# Patient Record
Sex: Female | Born: 1965 | Race: Black or African American | Hispanic: No | Smoking: Never smoker
Health system: Southern US, Community
[De-identification: ages and names within clinical notes are randomized; demographics above are authoritative.]

## PROBLEM LIST (undated history)

## (undated) DIAGNOSIS — I10 Essential (primary) hypertension: Secondary | ICD-10-CM

## (undated) DIAGNOSIS — J45909 Unspecified asthma, uncomplicated: Secondary | ICD-10-CM

## (undated) DIAGNOSIS — IMO0002 Reserved for concepts with insufficient information to code with codable children: Secondary | ICD-10-CM

## (undated) DIAGNOSIS — M329 Systemic lupus erythematosus, unspecified: Secondary | ICD-10-CM

## (undated) HISTORY — PX: TUBAL LIGATION: SHX77

## (undated) HISTORY — PX: ABDOMINAL HYSTERECTOMY: SHX81

## (undated) HISTORY — PX: TONSILLECTOMY: SUR1361

---

## 2012-11-22 ENCOUNTER — Ambulatory Visit: Payer: Self-pay | Admitting: Family Medicine

## 2012-11-22 ENCOUNTER — Emergency Department: Payer: Self-pay | Admitting: Emergency Medicine

## 2012-11-22 LAB — URINALYSIS, COMPLETE
Bacteria: NEGATIVE
Bacteria: NONE SEEN
Bilirubin,UR: NEGATIVE
Blood: NEGATIVE
Blood: NEGATIVE
Glucose,UR: NEGATIVE mg/dL (ref 0–75)
Glucose,UR: NEGATIVE mg/dL (ref 0–75)
Ketone: NEGATIVE
Leukocyte Esterase: NEGATIVE
Leukocyte Esterase: NEGATIVE
Nitrite: NEGATIVE
Nitrite: NEGATIVE
Ph: 5 (ref 4.5–8.0)
RBC,UR: 1 /HPF (ref 0–5)
Specific Gravity: 1.021 (ref 1.003–1.030)
Squamous Epithelial: 1
WBC UR: 1 /HPF (ref 0–5)

## 2012-11-22 LAB — COMPREHENSIVE METABOLIC PANEL
Albumin: 3.8 g/dL (ref 3.4–5.0)
Anion Gap: 3 — ABNORMAL LOW (ref 7–16)
BUN: 14 mg/dL (ref 7–18)
Bilirubin,Total: 0.3 mg/dL (ref 0.2–1.0)
Calcium, Total: 9.1 mg/dL (ref 8.5–10.1)
Chloride: 106 mmol/L (ref 98–107)
EGFR (African American): >60
Glucose: 99 mg/dL (ref 65–99)
Osmolality: 276 (ref 275–301)
Potassium: 4.2 mmol/L (ref 3.5–5.1)
SGOT(AST): 17 U/L (ref 15–37)
Sodium: 138 mmol/L (ref 136–145)
Total Protein: 8.3 g/dL — ABNORMAL HIGH (ref 6.4–8.2)

## 2012-11-22 LAB — CBC
HCT: 36 % (ref 35.0–47.0)
HGB: 11.5 g/dL — ABNORMAL LOW (ref 12.0–16.0)
MCH: 23.6 pg — ABNORMAL LOW (ref 26.0–34.0)
MCHC: 32 g/dL (ref 32.0–36.0)
RDW: 16.7 % — ABNORMAL HIGH (ref 11.5–14.5)

## 2012-11-22 LAB — LIPASE, BLOOD: Lipase: 169 U/L (ref 73–393)

## 2012-11-23 LAB — GC/CHLAMYDIA PROBE AMP

## 2012-11-24 LAB — URINE CULTURE

## 2013-04-26 ENCOUNTER — Ambulatory Visit: Payer: Self-pay | Admitting: Physician Assistant

## 2013-05-12 ENCOUNTER — Ambulatory Visit: Payer: Self-pay | Admitting: Physician Assistant

## 2013-05-16 ENCOUNTER — Emergency Department: Payer: Self-pay | Admitting: Emergency Medicine

## 2013-05-16 LAB — URINALYSIS, COMPLETE
Bacteria: NONE SEEN
Bilirubin,UR: NEGATIVE
Blood: NEGATIVE
GLUCOSE, UR: NEGATIVE mg/dL (ref 0–75)
Ketone: NEGATIVE
LEUKOCYTE ESTERASE: NEGATIVE
NITRITE: NEGATIVE
Ph: 5 (ref 4.5–8.0)
Protein: NEGATIVE
RBC,UR: 2 /HPF (ref 0–5)
Specific Gravity: 1.019 (ref 1.003–1.030)
Squamous Epithelial: 1

## 2013-05-16 LAB — GC/CHLAMYDIA PROBE AMP

## 2013-05-16 LAB — WET PREP, GENITAL

## 2014-03-18 ENCOUNTER — Emergency Department: Payer: Self-pay | Admitting: Emergency Medicine

## 2014-03-18 LAB — BASIC METABOLIC PANEL
Anion Gap: 6 — ABNORMAL LOW (ref 7–16)
BUN: 16 mg/dL (ref 7–18)
CALCIUM: 8.3 mg/dL — AB (ref 8.5–10.1)
CREATININE: 1.12 mg/dL (ref 0.60–1.30)
Chloride: 105 mmol/L (ref 98–107)
Co2: 31 mmol/L (ref 21–32)
EGFR (African American): >60
EGFR (Non-African Amer.): 55 — ABNORMAL LOW
GLUCOSE: 120 mg/dL — AB (ref 65–99)
OSMOLALITY: 286 (ref 275–301)
Potassium: 3.4 mmol/L — ABNORMAL LOW (ref 3.5–5.1)
Sodium: 142 mmol/L (ref 136–145)

## 2014-03-18 LAB — CBC
HCT: 36 % (ref 35.0–47.0)
HGB: 11.1 g/dL — ABNORMAL LOW (ref 12.0–16.0)
MCH: 23.1 pg — ABNORMAL LOW (ref 26.0–34.0)
MCHC: 30.8 g/dL — ABNORMAL LOW (ref 32.0–36.0)
MCV: 75 fL — ABNORMAL LOW (ref 80–100)
PLATELETS: 320 10*3/uL (ref 150–440)
RBC: 4.79 10*6/uL (ref 3.80–5.20)
RDW: 16.9 % — ABNORMAL HIGH (ref 11.5–14.5)
WBC: 7.4 10*3/uL (ref 3.6–11.0)

## 2014-03-18 LAB — TROPONIN I
Troponin-I: <0.02 ng/mL
Troponin-I: <0.02 ng/mL

## 2014-04-26 ENCOUNTER — Ambulatory Visit: Payer: Self-pay | Admitting: Physician Assistant

## 2015-03-19 ENCOUNTER — Ambulatory Visit
Admission: EM | Admit: 2015-03-19 | Discharge: 2015-03-19 | Disposition: A | Discharge status: Home / Self Care | Payer: Medicare Other | Attending: Family Medicine | Admitting: Family Medicine

## 2015-03-19 ENCOUNTER — Ambulatory Visit: Payer: Medicare Other

## 2015-03-19 ENCOUNTER — Encounter: Payer: Self-pay | Admitting: Gynecology

## 2015-03-19 DIAGNOSIS — B373 Candidiasis of vulva and vagina: Secondary | ICD-10-CM

## 2015-03-19 DIAGNOSIS — J209 Acute bronchitis, unspecified: Secondary | ICD-10-CM

## 2015-03-19 DIAGNOSIS — H6593 Unspecified nonsuppurative otitis media, bilateral: Secondary | ICD-10-CM

## 2015-03-19 DIAGNOSIS — M940 Chondrocostal junction syndrome [Tietze]: Secondary | ICD-10-CM

## 2015-03-19 DIAGNOSIS — N390 Urinary tract infection, site not specified: Secondary | ICD-10-CM

## 2015-03-19 DIAGNOSIS — J0110 Acute frontal sinusitis, unspecified: Secondary | ICD-10-CM | POA: Diagnosis not present

## 2015-03-19 DIAGNOSIS — B3731 Acute candidiasis of vulva and vagina: Secondary | ICD-10-CM

## 2015-03-19 HISTORY — DX: Systemic lupus erythematosus, unspecified: M32.9

## 2015-03-19 HISTORY — DX: Essential (primary) hypertension: I10

## 2015-03-19 HISTORY — DX: Reserved for concepts with insufficient information to code with codable children: IMO0002

## 2015-03-19 LAB — URINALYSIS COMPLETE WITH MICROSCOPIC (ARMC ONLY)
Bilirubin Urine: NEGATIVE
Glucose, UA: NEGATIVE mg/dL
HGB URINE DIPSTICK: NEGATIVE
Ketones, ur: NEGATIVE mg/dL
NITRITE: NEGATIVE
PH: 6.5 (ref 5.0–8.0)
Protein, ur: NEGATIVE mg/dL
RBC / HPF: NONE SEEN RBC/hpf (ref <3)
Specific Gravity, Urine: 1.01 (ref 1.005–1.030)

## 2015-03-19 MED ORDER — ACETAMINOPHEN 500 MG PO TABS: 1000.0000 mg | ORAL_TABLET | Freq: Four times a day (QID) | ORAL | Status: AC | PRN

## 2015-03-19 MED ORDER — FLUCONAZOLE 150 MG PO TABS: 150.0000 mg | ORAL_TABLET | Freq: Once | ORAL | Status: AC

## 2015-03-19 MED ORDER — CEFUROXIME AXETIL 500 MG PO TABS: 500.0000 mg | ORAL_TABLET | Freq: Two times a day (BID) | ORAL | Status: AC

## 2015-03-19 MED ORDER — SALINE SPRAY 0.65 % NA SOLN: 2.0000 | NASAL | Status: AC

## 2015-03-19 MED ORDER — PREDNISONE 50 MG PO TABS: 50.0000 mg | ORAL_TABLET | Freq: Every day | ORAL | Status: AC

## 2015-03-19 NOTE — ED Notes (Signed)
Patient stated [facility]. Per patient not feeling any better. Patient c/o burning and itching with urnitation / non-productive cough and back pain.

## 2015-03-19 NOTE — ED Provider Notes (Signed)
CSN: [id number]     Arrival date & time 03/19/15  1220 History   First MD Initiated Contact with Patient 03/19/15 1347     Chief Complaint  Patient presents with  . Recurrent UTI  . URI   (Consider location/radiation/quality/duration/timing/severity/associated sxs/prior Treatment) HPI Comments: Single african [place] female divorced has been sick since 17 Feb 2014 with upper respiratory infection chills, cough nonproductive  Was on fluconazole and amoxicillin x 10 days for UTI and yeast infection in October urinary symptoms didn't resolve; upper and lower back pain, urinary stress incontienence with cough  Rare productive cough mucinex  PMHx; sjogrens, lupus, hypertension    Patient is a 49 y.o. female presenting with URI. The history is provided by the patient.  URI Presenting symptoms: congestion, cough, facial pain, fatigue, rhinorrhea and sore throat   Presenting symptoms: no ear pain and no fever   Congestion:    Location:  Nasal and chest   Interferes with sleep: no     Interferes with eating/drinking: no   Cough:    Cough characteristics:  Productive, non-productive and hacking   Sputum characteristics:  Nondescript   Severity:  Mild   Onset quality:  Gradual   Duration:  1 month   Timing:  Constant   Progression:  Unchanged   Chronicity:  Chronic Fatigue:    Severity:  Moderate   Duration:  1 month   Timing:  Constant   Progression:  Unchanged Rhinorrhea:    Quality:  Clear   Severity:  Mild   Duration:  1 month   Timing:  Intermittent   Progression:  Unchanged Sore throat:    Severity:  Mild   Onset quality:  Gradual   Duration:  1 month   Timing:  Constant   Progression:  Unchanged Severity:  Mild Duration:  1 month Timing:  Constant Progression:  Unchanged Chronicity:  Chronic Relieved by:  Nothing Worsened by:  Breathing, certain positions, drinking and eating Ineffective treatments:  Rest, prescription medications, OTC medications, hot fluids,  drinking, certain positions and breathing Associated symptoms: headaches, myalgias and sinus pain   Associated symptoms: no arthralgias, no neck pain, no sneezing, no swollen glands and no wheezing   Headaches:    Severity:  Moderate   Onset quality:  Gradual   Duration:  1 month   Timing:  Constant   Progression:  Unchanged   Chronicity:  Chronic Myalgias:    Location:  Back   Quality:  Sharp and aching   Severity:  Moderate   Onset quality:  Sudden   Duration:  1 month   Timing:  Constant   Progression:  Worsening Risk factors: chronic cardiac disease, immunosuppression, recent illness and recent travel   Risk factors: not elderly, no chronic kidney disease, no chronic respiratory disease, no diabetes mellitus and no sick contacts     Past Medical History  Diagnosis Date  . Hypertension   . Lupus [facility])    Past Surgical History  Procedure Laterality Date  . Abdominal hysterectomy    . Tonsillectomy    . Tubal ligation     No family history on file. Social History  Substance Use Topics  . Smoking status: Never Smoker   . Smokeless tobacco: None  . Alcohol Use: No   OB History    No data available     Review of Systems  Constitutional: Positive for chills, activity change, appetite change and fatigue. Negative for fever, diaphoresis and unexpected weight change.  HENT: Positive for  congestion, nosebleeds, postnasal drip, rhinorrhea, sinus pressure and sore throat. Negative for dental problem, drooling, ear discharge, ear pain, facial swelling, hearing loss, mouth sores, sneezing, tinnitus, trouble swallowing and voice change.   Eyes: Negative for photophobia, pain, discharge, redness, itching and visual disturbance.  Respiratory: Positive for cough. Negative for choking, chest tightness, shortness of breath, wheezing and stridor.   Cardiovascular: Negative for chest pain, palpitations and leg swelling.  Gastrointestinal: Negative for nausea, vomiting, abdominal pain,  diarrhea, constipation, blood in stool and abdominal distention.  Endocrine: Negative for cold intolerance and heat intolerance.  Genitourinary: Positive for dysuria, urgency, frequency and vaginal discharge. Negative for hematuria, flank pain, decreased urine volume, vaginal bleeding, enuresis, difficulty urinating, genital sores, vaginal pain, menstrual problem and pelvic pain.  Musculoskeletal: Positive for myalgias. Negative for back pain, joint swelling, arthralgias, gait problem, neck pain and neck stiffness.  Skin: Negative for color change, pallor, rash and wound.  Allergic/Immunologic: Negative for environmental allergies and food allergies.  Neurological: Positive for headaches. Negative for dizziness, tremors, seizures, syncope, facial asymmetry, speech difficulty, weakness, light-headedness and numbness.  Hematological: Negative for adenopathy. Does not bruise/bleed easily.  Psychiatric/Behavioral: Negative for behavioral problems, confusion, sleep disturbance and agitation.    Allergies  Review of patient's allergies indicates no known allergies.  Home Medications   Prior to Admission medications   Medication Sig Start Date End Date Taking? Authorizing Provider  valsartan-hydrochlorothiazide (DIOVAN-HCT) 160-12.5 MG tablet Take 1 tablet by mouth daily.   Yes Historical Provider, MD  acetaminophen (TYLENOL) 500 MG tablet Take 2 tablets (1,000 mg total) by mouth every 6 (six) hours as needed for mild pain, moderate pain or headache. 03/19/15 03/26/15  Barbaraann Barthelina A Betancourt, NP  cefUROXime (CEFTIN) 500 MG tablet Take 1 tablet (500 mg total) by mouth 2 (two) times daily with a meal. 03/19/15 03/28/15  Barbaraann Barthelina A Betancourt, NP  fluconazole (DIFLUCAN) 150 MG tablet Take 1 tablet (150 mg total) by mouth once. May repeat in 3 days if needed 03/19/15 03/26/15  Barbaraann Barthelina A Betancourt, NP  predniSONE (DELTASONE) 50 MG tablet Take 1 tablet (50 mg total) by mouth daily with breakfast. 03/19/15 03/23/15   Barbaraann Barthelina A Betancourt, NP  sodium chloride (OCEAN) 0.65 % SOLN nasal spray Place 2 sprays into both nostrils every 2 (two) hours while awake. 03/19/15   Barbaraann Barthelina A Betancourt, NP   Meds Ordered and Administered this Visit  Medications - No data to display  BP 142/59 mmHg  Pulse 81  Temp(Src) 96.6 F (35.9 C) (Tympanic)  Resp 18  Ht 5\' 8"  (1.727 m)  Wt 296 lb (134.265 kg)  BMI 45.02 kg/m2  SpO2 100% No data found.   Physical Exam  Constitutional: She is oriented to person, place, and time. She appears well-developed and well-nourished. She is active and cooperative.  Non-toxic appearance. She does not have a sickly appearance. She appears ill. No distress.    HENT:  Head: Normocephalic and atraumatic.  Right Ear: Hearing, external ear and ear canal normal. A middle ear effusion is present.  Left Ear: Hearing, external ear and ear canal normal. A middle ear effusion is present.  Nose: Mucosal edema and rhinorrhea present. No nose lacerations, sinus tenderness, nasal deformity, septal deviation or nasal septal hematoma. No epistaxis.  No foreign bodies. Right sinus exhibits maxillary sinus tenderness and frontal sinus tenderness. Left sinus exhibits maxillary sinus tenderness and frontal sinus tenderness.  Mouth/Throat: Uvula is midline and mucous membranes are normal. Mucous membranes are not pale, not dry and  not cyanotic. She does not have dentures. No oral lesions. No trismus in the jaw. Normal dentition. No dental abscesses, uvula swelling, lacerations or dental caries. Posterior oropharyngeal edema and posterior oropharyngeal erythema present. No oropharyngeal exudate or tonsillar abscesses.  Bilateral maxillary and frontal sinuses TTP equally; cobblestoning posterior pharynx nasal turbinates with edema/erythema clear discharge congestion; bilateral TMs with air fluid level slight opacity  Eyes: Conjunctivae, EOM and lids are normal. Pupils are equal, round, and reactive to light. Right eye  exhibits no chemosis, no discharge, no exudate and no hordeolum. No foreign body present in the right eye. Left eye exhibits no chemosis, no discharge, no exudate and no hordeolum. No foreign body present in the left eye. Right conjunctiva is not injected. Right conjunctiva has no hemorrhage. Left conjunctiva is not injected. Left conjunctiva has no hemorrhage. No scleral icterus. Right eye exhibits normal extraocular motion and no nystagmus. Left eye exhibits normal extraocular motion and no nystagmus. Right pupil is round and reactive. Left pupil is round and reactive. Pupils are equal.  Neck: Trachea normal and normal range of motion. Neck supple. No tracheal tenderness, no spinous process tenderness and no muscular tenderness present. No rigidity. No tracheal deviation, no edema, no erythema and normal range of motion present. No thyroid mass and no thyromegaly present.  Cardiovascular: Normal rate, regular rhythm, S1 normal, S2 normal, normal heart sounds and intact distal pulses.  PMI is not displaced.  Exam reveals no gallop and no friction rub.   No murmur heard. Pulmonary/Chest: Effort normal and breath sounds normal. No accessory muscle usage or stridor. No respiratory distress. She has no decreased breath sounds. She has no wheezes. She has no rhonchi. She has no rales. She exhibits no tenderness.  Frequent nonprodutive cough during exam interrupts sentences and worsens with deep breaths  Abdominal: Soft. She exhibits no distension.  Musculoskeletal: Normal range of motion. She exhibits no edema.       Right shoulder: Normal.       Left shoulder: Normal.       Right elbow: Normal.      Left elbow: Normal.       Right wrist: Normal.       Left wrist: Normal.       Right hip: Normal.       Left hip: Normal.       Right knee: Normal.       Left knee: Normal.       Right ankle: Normal.       Left ankle: Normal.       Cervical back: Normal.       Thoracic back: She exhibits pain. She  exhibits normal range of motion, no tenderness, no bony tenderness, no swelling, no edema, no deformity, no laceration, no spasm and normal pulse.       Lumbar back: She exhibits pain. She exhibits normal range of motion, no tenderness, no bony tenderness, no swelling, no edema, no deformity, no laceration, no spasm and normal pulse.       Back:       Right hand: Normal.       Left hand: Normal.  Lymphadenopathy:       Head (right side): No submental, no submandibular, no tonsillar, no preauricular, no posterior auricular and no occipital adenopathy present.       Head (left side): No submental, no submandibular, no tonsillar, no preauricular, no posterior auricular and no occipital adenopathy present.    She has no cervical adenopathy.  Right cervical: No superficial cervical, no deep cervical and no posterior cervical adenopathy present.      Left cervical: No superficial cervical, no deep cervical and no posterior cervical adenopathy present.  Neurological: She is alert and oriented to person, place, and time. She has normal strength. She is not disoriented. She displays no atrophy and no tremor. No cranial nerve deficit or sensory deficit. She exhibits normal muscle tone. She displays no seizure activity. Coordination and gait normal. GCS eye subscore is 4. GCS verbal subscore is 5. GCS motor subscore is 6.  Skin: Skin is warm, dry and intact. No abrasion, no bruising, no burn, no ecchymosis, no laceration, no lesion, no petechiae and no rash noted. She is not diaphoretic. No cyanosis or erythema. No pallor. Nails show no clubbing.  Psychiatric: She has a normal mood and affect. Her speech is normal and behavior is normal. Judgment and thought content normal. Cognition and memory are normal.  Nursing note and vitals reviewed.   ED Course  Procedures (including critical care time)  Labs Review Labs Reviewed  URINALYSIS COMPLETEWITH MICROSCOPIC ([facility] ONLY) - Abnormal; Notable for the  following:    Color, Urine STRAW (*)    APPearance HAZY (*)    Leukocytes, UA TRACE (*)    Squamous Epithelial / LPF 6-30 (*)    All other components within normal limits  URINE CULTURE  VITAMIN B12    Imaging Review Dg Chest 2 View  03/19/2015  CLINICAL DATA:  Cough for 1 month which is occasionally productive. Initial encounter. EXAM: CHEST  2 VIEW COMPARISON:  PA and lateral chest 03/18/2014. FINDINGS: The lungs are clear. Heart size is normal. No pneumothorax or pleural effusion. No focal bony abnormality. IMPRESSION: No acute disease. Electronically Signed   By: Drusilla Kanner M.D.   On: 03/19/2015 14:17    1400 discussed urinalysis results with patient and given copy of report.  Discussed urine culture results typically available in 48 hours and will call with results once available.  Patient verbalized understanding of information/instructions, agreed with plan of care and had no further questions at this time.  1440 patient requested vitamin D and B12 levels.  Does not have PCM at this time.  Discussed with patient labs will take a couple days to result.  Patient concerned with frequent infections her B12 or Vitamin D low.  Patient to establish care with a PCM.  Patient visiting family in [place] for a couple more weeks before returning home to [facility]. Patient verbalized understanding of information/instructions, agreed with plan of care and had no further questions at this time.  MDM   1. Acute bronchitis, unspecified organism   2. UTI (lower urinary tract infection)   3. Otitis media with effusion, bilateral   4. Costochondritis, acute   5. Acute frontal sinusitis, recurrence not specified   6. Candidiasis of vagina    Tylenol  po QId prn pain.  Ceftin  po BID x 10 days given for UTI/sinusitis and to cover for pneumonia.  Prednisone  po daily x 5 days with breakfast.  Nasal saline 2 sprays each nostril q2h prn congestion.  Bronchitis simple, community acquired,  may have started as viral (probably respiratory syncytial, parainfluenza, influenza, or adenovirus), but now evidence of acute purulent bronchitis with resultant bronchial edema and mucus formation.  Viruses are the most common cause of bronchial inflammation in otherwise healthy adults with acute bronchitis.  The appearance of sputum is not predictive of whether a bacterial infection  is present.  Purulent sputum is most often caused by viral infections.  There are a small portion of those caused by non-viral agents being Mycoplamsa pneumonia.  Microscopic examination or C&S of sputum in the healthy adult with acute bronchitis is generally not helpful (usually negative or normal respiratory flora) other considerations being cough from upper respiratory tract infections, sinusitis or allergic syndromes (mild asthma or viral pneumonia).  Differential Diagnosis:  reactive airway disease (asthma, allergic aspergillosis (eosinophilia), chronic bronchitis, respiratory infection (Sinusitis, Common cold, pneumonia), congestive heart failure, reflux esophagitis, bronchogenic tumor, aspiration syndromes and/or exposure irritants/tobacco smoke.  In this case, there is no evidence of any invasive bacterial illness.  Most likely viral etiology so will hold on antibiotic treatment.  Advise supportive care with rest, encourage fluids, good hygiene and watch for any worsening symptoms.  If they were to develop:  come back to the office or go to the emergency room if after hours. Without high fever, severe dyspnea, lack of physical findings or other risk factors, I will hold on CBC at this time.  I discussed that approximately 50% of patients with acute bronchitis have a cough that lasts up to three weeks, and 25% for over a month.  Tylenol, one to two tablets every four hours as needed for fever or myalgias.   No aspirin.  Patient instructed to follow up in one week or sooner if symptoms worsen. Patient verbalized agreement and  understanding of treatment plan.  P2:  hand washing and cover cough  Medications as directed. ceftin 500mg  po BID x 10 days patient preferred as trouble with yeast infection and many antibiotics.  Given diflucan 150mg  po may repeat in 72 hours if recurrent Rx. Patient is also to push fluids and refused pyridium Rx.  Hydrate, avoid dehydration.  Avoid holding urine void on frequent basis every 4 to 6 hours.  If unable to void every 8 hours follow up for re-evaluation with PCM, urgent care or ER.   Call or return to clinic as needed if these symptoms worsen or fail to improve as anticipated.  Exitcare handout on cystitis given to patient Patient verbalized agreement and understanding of treatment plan and had no further questions at this time. P2:  Hydrate and cranberry juice  Doesn't do nose sprays encouraged use of saline in shower 2 sprays each nostril. ceftin 500mg  po BID x 10 days.  No evidence of systemic bacterial infection, non toxic and well hydrated.  I do not see where any further testing or imaging is necessary at this time.   I will suggest supportive care, rest, good hygiene and encourage the patient to take adequate fluids.  The patient is to return to clinic or EMERGENCY ROOM if symptoms worsen or change significantly.  Exitcare handout on sinusitis given to patient.  Patient verbalized agreement and understanding of treatment plan and had no further questions at this time.   P2:  Hand washing and cover cough  Has current infection may take diflucan now and repeat in 72 hours if worsening with antibiotic or wait until antibiotic course completed.  Avoid sex during therapy period.   Condom efficacy could be affected. Exitcare handout on candidiasis vaginal given to patient.  RTC if worsening symptoms or new symptoms. Encouraged yogurt intake with active cultures.  Avoid douching.  Patient verbalized understanding and agreed with plan of care and had no further questions at this time.   P2:   hygiene, hand washing, cotton underwear   Barbaraann Barthel,  NP 03/20/15 2005

## 2015-03-19 NOTE — Discharge Instructions (Signed)
Acute Bronchitis Bronchitis is inflammation of the airways that extend from the windpipe into the lungs (bronchi). The inflammation often causes mucus to develop. This leads to a cough, which is the most common symptom of bronchitis.  In acute bronchitis, the condition usually develops suddenly and goes away over time, usually in a couple weeks. Smoking, allergies, and asthma can make bronchitis worse. Repeated episodes of bronchitis may cause further lung problems.  CAUSES Acute bronchitis is most often caused by the same virus that causes a cold. The virus can spread from person to person (contagious) through coughing, sneezing, and touching contaminated objects. SIGNS AND SYMPTOMS   Cough.   Fever.   Coughing up mucus.   Body aches.   Chest congestion.   Chills.   Shortness of breath.   Sore throat.  DIAGNOSIS  Acute bronchitis is usually diagnosed through a physical exam. Your health care provider will also ask you questions about your medical history. Tests, such as chest X-rays, are sometimes done to rule out other conditions.  TREATMENT  Acute bronchitis usually goes away in a couple weeks. Oftentimes, no medical treatment is necessary. Medicines are sometimes given for relief of fever or cough. Antibiotic medicines are usually not needed but may be prescribed in certain situations. In some cases, an inhaler may be recommended to help reduce shortness of breath and control the cough. A cool mist vaporizer may also be used to help thin bronchial secretions and make it easier to clear the chest.  HOME CARE INSTRUCTIONS  Get plenty of rest.   Drink enough fluids to keep your urine clear or pale yellow (unless you have a medical condition that requires fluid restriction). Increasing fluids may help thin your respiratory secretions (sputum) and reduce chest congestion, and it will prevent dehydration.   Take medicines only as directed by your health care provider.  If  you were prescribed an antibiotic medicine, finish it all even if you start to feel better.  Avoid smoking and secondhand smoke. Exposure to cigarette smoke or irritating chemicals will make bronchitis worse. If you are a smoker, consider using nicotine gum or skin patches to help control withdrawal symptoms. Quitting smoking will help your lungs heal faster.   Reduce the chances of another bout of acute bronchitis by washing your hands frequently, avoiding people with cold symptoms, and trying not to touch your hands to your mouth, nose, or eyes.   Keep all follow-up visits as directed by your health care provider.  SEEK MEDICAL CARE IF: Your symptoms do not improve after 1 week of treatment.  SEEK IMMEDIATE MEDICAL CARE IF:  You develop an increased fever or chills.   You have chest pain.   You have severe shortness of breath.  You have bloody sputum.   You develop dehydration.  You faint or repeatedly feel like you are going to pass out.  You develop repeated vomiting.  You develop a severe headache. MAKE SURE YOU:   Understand these instructions.  Will watch your condition.  Will get help right away if you are not doing well or get worse.   This information is not intended to replace advice given to you by your health care provider. Make sure you discuss any questions you have with your health care provider.   Document Released: 05/30/2004 Document Revised: 05/13/2014 Document Reviewed: 10/13/2012 Elsevier Interactive Patient Education 2016 [employer] Costochondritis Costochondritis, sometimes called Tietze syndrome, is a swelling and irritation (inflammation) of the tissue (cartilage) that connects your  ribs with your breastbone (sternum). It causes pain in the chest and rib area. Costochondritis usually goes away on its own over time. It can take up to 6 weeks or longer to get better, especially if you are unable to limit your activities. CAUSES  Some cases of  costochondritis have no known cause. Possible causes include:  Injury (trauma).  Exercise or activity such as lifting.  Severe coughing. SIGNS AND SYMPTOMS  Pain and tenderness in the chest and rib area.  Pain that gets worse when coughing or taking deep breaths.  Pain that gets worse with specific movements. DIAGNOSIS  Your health care provider will do a physical exam and ask about your symptoms. Chest X-rays or other tests may be done to rule out other problems. TREATMENT  Costochondritis usually goes away on its own over time. Your health care provider may prescribe medicine to help relieve pain. HOME CARE INSTRUCTIONS   Avoid exhausting physical activity. Try not to strain your ribs during normal activity. This would include any activities using chest, abdominal, and side muscles, especially if heavy weights are used.  Apply ice to the affected area for the first 2 days after the pain begins.  Put ice in a plastic bag.  Place a towel between your skin and the bag.  Leave the ice on for 20 minutes, 2-3 times a day.  Only take over-the-counter or prescription medicines as directed by your health care provider. SEEK MEDICAL CARE IF:  You have redness or swelling at the rib joints. These are signs of infection.  Your pain does not go away despite rest or medicine. SEEK IMMEDIATE MEDICAL CARE IF:   Your pain increases or you are very uncomfortable.  You have shortness of breath or difficulty breathing.  You cough up blood.  You have worse chest pains, sweating, or vomiting.  You have a fever or persistent symptoms for more than 2-3 days.  You have a fever and your symptoms suddenly get worse. MAKE SURE YOU:   Understand these instructions.  Will watch your condition.  Will get help right away if you are not doing well or get worse.   This information is not intended to replace advice given to you by your health care provider. Make sure you discuss any questions  you have with your health care provider.   Document Released: 01/30/2005 Document Revised: 02/10/2013 Document Reviewed: 11/24/2012 Elsevier Interactive Patient Education 2016 [employer] Otitis Media With Effusion Otitis media with effusion is the presence of fluid in the middle ear. This is a common problem in children, which often follows ear infections. It may be present for weeks or longer after the infection. Unlike an acute ear infection, otitis media with effusion refers only to fluid behind the ear drum and not infection. Children with repeated ear and sinus infections and allergy problems are the most likely to get otitis media with effusion. CAUSES  The most frequent cause of the fluid buildup is dysfunction of the eustachian tubes. These are the tubes that drain fluid in the ears to the back of the nose (nasopharynx). SYMPTOMS   The main symptom of this condition is hearing loss. As a result, you or your child may:  Listen to the TV at a loud volume.  Not respond to questions.  Ask "what" often when spoken to.  Mistake or confuse one sound or word for another.  There may be a sensation of fullness or pressure but usually not pain. DIAGNOSIS   Your  health care provider will diagnose this condition by examining you or your child's ears.  Your health care provider may test the pressure in you or your child's ear with a tympanometer.  A hearing test may be conducted if the problem persists. TREATMENT   Treatment depends on the duration and the effects of the effusion.  Antibiotics, decongestants, nose drops, and cortisone-type drugs (tablets or nasal spray) may not be helpful.  Children with persistent ear effusions may have delayed language or behavioral problems. Children at risk for developmental delays in hearing, learning, and speech may require referral to a specialist earlier than children not at risk.  You or your child's health care provider may suggest a  referral to an ear, nose, and throat surgeon for treatment. The following may help restore normal hearing:  Drainage of fluid.  Placement of ear tubes (tympanostomy tubes).  Removal of adenoids (adenoidectomy). HOME CARE INSTRUCTIONS   Avoid secondhand smoke.  Infants who are breastfed are less likely to have this condition.  Avoid feeding infants while they are lying flat.  Avoid known environmental allergens.  Avoid people who are sick. SEEK MEDICAL CARE IF:   Hearing is not better in 3 months.  Hearing is worse.  Ear pain.  Drainage from the ear.  Dizziness. MAKE SURE YOU:   Understand these instructions.  Will watch your condition.  Will get help right away if you are not doing well or get worse.   This information is not intended to replace advice given to you by your health care provider. Make sure you discuss any questions you have with your health care provider.   Document Released: 05/30/2004 Document Revised: 05/13/2014 Document Reviewed: 11/17/2012 Elsevier Interactive Patient Education 2016 [employer] Urinary Tract Infection Urinary tract infections (UTIs) can develop anywhere along your urinary tract. Your urinary tract is your body's drainage system for removing wastes and extra water. Your urinary tract includes two kidneys, two ureters, a bladder, and a urethra. Your kidneys are a pair of bean-shaped organs. Each kidney is about the size of your fist. They are located below your ribs, one on each side of your spine. CAUSES Infections are caused by microbes, which are microscopic organisms, including fungi, viruses, and bacteria. These organisms are so small that they can only be seen through a microscope. Bacteria are the microbes that most commonly cause UTIs. SYMPTOMS  Symptoms of UTIs may vary by age and gender of the patient and by the location of the infection. Symptoms in young women typically include a frequent and intense urge to urinate and a  painful, burning feeling in the bladder or urethra during urination. Older women and men are more likely to be tired, shaky, and weak and have muscle aches and abdominal pain. A fever may mean the infection is in your kidneys. Other symptoms of a kidney infection include pain in your back or sides below the ribs, nausea, and vomiting. DIAGNOSIS To diagnose a UTI, your caregiver will ask you about your symptoms. Your caregiver will also ask you to provide a urine sample. The urine sample will be tested for bacteria and white blood cells. White blood cells are made by your body to help fight infection. TREATMENT  Typically, UTIs can be treated with medication. Because most UTIs are caused by a bacterial infection, they usually can be treated with the use of antibiotics. The choice of antibiotic and length of treatment depend on your symptoms and the type of bacteria causing your infection. HOME  CARE INSTRUCTIONS  If you were prescribed antibiotics, take them exactly as your caregiver instructs you. Finish the medication even if you feel better after you have only taken some of the medication.  Drink enough water and fluids to keep your urine clear or pale yellow.  Avoid caffeine, tea, and carbonated beverages. They tend to irritate your bladder.  Empty your bladder often. Avoid holding urine for long periods of time.  Empty your bladder before and after sexual intercourse.  After a bowel movement, women should cleanse from front to back. Use each tissue only once. SEEK MEDICAL CARE IF:   You have back pain.  You develop a fever.  Your symptoms do not begin to resolve within 3 days. SEEK IMMEDIATE MEDICAL CARE IF:   You have severe back pain or lower abdominal pain.  You develop chills.  You have nausea or vomiting. You have continued burning or discomfort with urination.Sinusitis, Adult Sinusitis is redness, soreness, and inflammation of the paranasal sinuses. Paranasal sinuses are air  pockets within the bones of your face. They are located beneath your eyes, in the middle of your forehead, and above your eyes. In healthy paranasal sinuses, mucus is able to drain out, and air is able to circulate through them by way of your nose. However, when your paranasal sinuses are inflamed, mucus and air can become trapped. This can allow bacteria and other germs to grow and cause infection. Sinusitis can develop quickly and last only a short time (acute) or continue over a long period (chronic). Sinusitis that lasts for more than 12 weeks is considered chronic. CAUSES Causes of sinusitis include:  Allergies.  Structural abnormalities, such as displacement of the cartilage that separates your nostrils (deviated septum), which can decrease the air flow through your nose and sinuses and affect sinus drainage.  Functional abnormalities, such as when the small hairs (cilia) that line your sinuses and help remove mucus do not work properly or are not present. SIGNS AND SYMPTOMS Symptoms of acute and chronic sinusitis are the same. The primary symptoms are pain and pressure around the affected sinuses. Other symptoms include:  Upper toothache.  Earache.  Headache.  Bad breath.  Decreased sense of smell and taste.  A cough, which worsens when you are lying flat.  Fatigue.  Fever.  Thick drainage from your nose, which often is green and may contain pus (purulent).  Swelling and warmth over the affected sinuses. DIAGNOSIS Your health care provider will perform a physical exam. During your exam, your health care provider may perform any of the following to help determine if you have acute sinusitis or chronic sinusitis:  Look in your nose for signs of abnormal growths in your nostrils (nasal polyps).  Tap over the affected sinus to check for signs of infection.  View the inside of your sinuses using an imaging device that has a light attached (endoscope). If your health care  provider suspects that you have chronic sinusitis, one or more of the following tests may be recommended:  Allergy tests.  Nasal culture. A sample of mucus is taken from your nose, sent to a lab, and screened for bacteria.  Nasal cytology. A sample of mucus is taken from your nose and examined by your health care provider to determine if your sinusitis is related to an allergy. TREATMENT Most cases of acute sinusitis are related to a viral infection and will resolve on their own within 10 days. Sometimes, medicines are prescribed to help relieve symptoms of  both acute and chronic sinusitis. These may include pain medicines, decongestants, nasal steroid sprays, or saline sprays. However, for sinusitis related to a bacterial infection, your health care provider will prescribe antibiotic medicines. These are medicines that will help kill the bacteria causing the infection. Rarely, sinusitis is caused by a fungal infection. In these cases, your health care provider will prescribe antifungal medicine. For some cases of chronic sinusitis, surgery is needed. Generally, these are cases in which sinusitis recurs more than 3 times per year, despite other treatments. HOME CARE INSTRUCTIONS  Drink plenty of water. Water helps thin the mucus so your sinuses can drain more easily.  Use a humidifier.  Inhale steam 3-4 times a day (for example, sit in the bathroom with the shower running).  Apply a warm, moist washcloth to your face 3-4 times a day, or as directed by your health care provider.  Use saline nasal sprays to help moisten and clean your sinuses.  Take medicines only as directed by your health care provider.  If you were prescribed either an antibiotic or antifungal medicine, finish it all even if you start to feel better. SEEK IMMEDIATE MEDICAL CARE IF:  You have increasing pain or severe headaches.  You have nausea, vomiting, or drowsiness.  You have swelling around your face.  You  have vision problems.  You have a stiff neck.  You have difficulty breathing.   This information is not intended to replace advice given to you by your health care provider. Make sure you discuss any questions you have with your health care provider.   Document Released: 04/22/2005 Document Revised: 05/13/2014 Document Reviewed: 05/07/2011 Elsevier Interactive Patient Education 2016 [employer] MAKE SURE YOU:   Understand these instructions.  Will watch your condition.  Will get help right away if you are not doing well or get worse.   This information is not intended to replace advice given to you by your health care provider. Make sure you discuss any questions you have with your health care provider.   Document Released: 01/30/2005 Document Revised: 01/11/2015 Document Reviewed: 05/31/2011 Elsevier Interactive Patient Education 2016 [employer] Monilial Vaginitis Vaginitis in a soreness, swelling and redness (inflammation) of the vagina and vulva. Monilial vaginitis is not a sexually transmitted infection. CAUSES  Yeast vaginitis is caused by yeast (candida) that is normally found in your vagina. With a yeast infection, the candida has overgrown in number to a point that upsets the chemical balance. SYMPTOMS   White, thick vaginal discharge.  Swelling, itching, redness and irritation of the vagina and possibly the lips of the vagina (vulva).  Burning or painful urination.  Painful intercourse. DIAGNOSIS  Things that may contribute to monilial vaginitis are:  Postmenopausal and virginal states.  Pregnancy.  Infections.  Being tired, sick or stressed, especially if you had monilial vaginitis in the past.  Diabetes. Good control will help lower the chance.  Birth control pills.  Tight fitting garments.  Using bubble bath, feminine sprays, douches or deodorant tampons.  Taking certain medications that kill germs (antibiotics).  Sporadic recurrence can  occur if you become ill. TREATMENT  Your caregiver will give you medication.  There are several kinds of anti monilial vaginal creams and suppositories specific for monilial vaginitis. For recurrent yeast infections, use a suppository or cream in the vagina 2 times a week, or as directed.  Anti-monilial or steroid cream for the itching or irritation of the vulva may also be used. Get your caregiver's permission.  Painting  the vagina with methylene blue solution may help if the monilial cream does not work.  Eating yogurt may help prevent monilial vaginitis. HOME CARE INSTRUCTIONS   Finish all medication as prescribed.  Do not have sex until treatment is completed or after your caregiver tells you it is okay.  Take warm sitz baths.  Do not douche.  Do not use tampons, especially scented ones.  Wear cotton underwear.  Avoid tight pants and panty hose.  Tell your sexual partner that you have a yeast infection. They should go to their caregiver if they have symptoms such as mild rash or itching.  Your sexual partner should be treated as well if your infection is difficult to eliminate.  Practice safer sex. Use condoms.  Some vaginal medications cause latex condoms to fail. Vaginal medications that harm condoms are:  Cleocin cream.  Butoconazole (Femstat).  Terconazole (Terazol) vaginal suppository.  Miconazole (Monistat) (may be purchased over the counter). SEEK MEDICAL CARE IF:   You have a temperature by mouth above 102 F (38.9 C).  The infection is getting worse after 2 days of treatment.  The infection is not getting better after 3 days of treatment.  You develop blisters in or around your vagina.  You develop vaginal bleeding, and it is not your menstrual period.  You have pain when you urinate.  You develop intestinal problems.  You have pain with sexual intercourse.   This information is not intended to replace advice given to you by your health  care provider. Make sure you discuss any questions you have with your health care provider.   Document Released: 01/30/2005 Document Revised: 07/15/2011 Document Reviewed: 10/24/2014 Elsevier Interactive Patient Education 2016 [employer]. [place], Adult Ginette Pitman, also called oral candidiasis, is a fungal infection that develops in the mouth and throat and on the tongue. It causes white patches to form on the mouth and tongue. Ginette Pitman is most common in older adults, but it can occur at any age.  Many cases of thrush are mild, but this infection can also be more serious. Ginette Pitman can be a recurring problem for people who have chronic illnesses or who take medicines that limit the body's ability to fight infection. Because these people have difficulty fighting infections, the fungus that causes thrush can spread throughout the body. This can cause life-threatening blood or organ infections. CAUSES  Ginette Pitman is usually caused by a yeast called Candida albicans. This fungus is normally present in small amounts in the mouth and on other mucous membranes. It usually causes no harm. However, when conditions are present that allow the fungus to grow uncontrolled, it invades surrounding tissues and becomes an infection. Less often, other Candida species can also lead to thrush.  RISK FACTORS Ginette Pitman is more likely to develop in the following people:  People with an impaired ability to fight infection (weakened immune system).   Older adults.   People with HIV.   People with diabetes.   People with dry mouth (xerostomia).   Pregnant women.   People with poor dental care, especially those who have false teeth.   People who use antibiotic medicines.  SIGNS AND SYMPTOMS  Ginette Pitman can be a mild infection that causes no symptoms. If symptoms develop, they may include:   A burning feeling in the mouth and throat. This can occur at the start of a thrush infection.   White patches that adhere to the  mouth and tongue. The tissue around the patches may be red, raw, and  painful. If rubbed (during tooth brushing, for example), the patches and the tissue of the mouth may bleed easily.   A bad taste in the mouth or difficulty tasting foods.   Cottony feeling in the mouth.   Pain during eating and swallowing. DIAGNOSIS  Your health care provider can usually diagnose thrush by looking in your mouth and asking you questions about your health.  TREATMENT  Medicines that help prevent the growth of fungi (antifungals) are the standard treatment for thrush. These medicines are either applied directly to the affected area (topical) or swallowed (oral). The treatment will depend on the severity of the condition.  Mild Thrush Mild cases of thrush may clear up with the use of an antifungal mouth rinse or lozenges. Treatment usually lasts about 14 days.  Moderate to Severe Thrush  More severe thrush infections that have spread to the esophagus are treated with an oral antifungal medicine. A topical antifungal medicine may also be used.   For some severe infections, a treatment period longer than 14 days may be needed.   Oral antifungal medicines are almost never used during pregnancy because the fetus may be harmed. However, if a pregnant woman has a rare, severe thrush infection that has spread to her blood, oral antifungal medicines may be used. In this case, the risk of harm to the mother and fetus from the severe thrush infection may be greater than the risk posed by the use of antifungal medicines.  Persistent or Recurrent Thrush For cases of thrush that do not go away or keep coming back, treatment may involve the following:   Treatment may be needed twice as long as the symptoms last.   Treatment will include both oral and topical antifungal medicines.   People with weakened immune systems can take an antifungal medicine on a continuous basis to prevent thrush infections.  It is  important to treat conditions that make you more likely to get thrush, such as diabetes or HIV.  HOME CARE INSTRUCTIONS   Only take over-the-counter or prescription medicine as directed by your health care provider. Talk to your health care provider about an over-the-counter medicine called gentian violet, which kills bacteria and fungi.   Eat plain, unflavored yogurt as directed by your health care provider. Check the label to make sure the yogurt contains live cultures. This yogurt can help healthy bacteria grow in the mouth that can stop the growth of the fungus that causes thrush.   Try these measures to help reduce the discomfort of thrush:   Drink cold liquids such as water or iced tea.   Try flavored ice treats or frozen juices.   Eat foods that are easy to swallow, such as gelatin, ice cream, or custard.   If the patches in your mouth are painful, try drinking from a straw.   Rinse your mouth several times a day with a warm saltwater rinse. You can make the saltwater mixture with 1 tsp (6 g) of salt in 8 fl oz (0.2 L) of warm water.   If you wear dentures, remove the dentures before going to bed, brush them vigorously, and soak them in a cleaning solution as directed by your health care provider.   Women who are breastfeeding should clean their nipples with an antifungal medicine as directed by their health care provider. Dry the nipples after breastfeeding. Applying lanolin-containing body lotion may help relieve nipple soreness.  SEEK MEDICAL CARE IF:  Your symptoms are getting worse or are not  improving within 7 days of starting treatment.   You have symptoms of spreading infection, such as white patches on the skin outside of the mouth.   You are nursing and you have redness, burning, or pain in the nipples that is not relieved with treatment.  MAKE SURE YOU:  Understand these instructions.  Will watch your condition.  Will get help right away if you are  not doing well or get worse.   This information is not intended to replace advice given to you by your health care provider. Make sure you discuss any questions you have with your health care provider.   Document Released: 01/16/2004 Document Revised: 05/13/2014 Document Reviewed: 11/23/2012 Elsevier Interactive Patient Education [employer]

## 2015-03-20 LAB — VITAMIN B12: Vitamin B-12: 425 pg/mL (ref 180–914)

## 2015-03-21 ENCOUNTER — Emergency Department
Admission: EM | Admit: 2015-03-21 | Discharge: 2015-03-21 | Disposition: A | Discharge status: Home / Self Care | Payer: Medicare Other | Attending: Emergency Medicine | Admitting: Emergency Medicine

## 2015-03-21 ENCOUNTER — Encounter: Payer: Self-pay | Admitting: *Deleted

## 2015-03-21 DIAGNOSIS — Z79899 Other long term (current) drug therapy: Secondary | ICD-10-CM | POA: Insufficient documentation

## 2015-03-21 DIAGNOSIS — Z7952 Long term (current) use of systemic steroids: Secondary | ICD-10-CM | POA: Diagnosis not present

## 2015-03-21 DIAGNOSIS — I10 Essential (primary) hypertension: Secondary | ICD-10-CM | POA: Diagnosis not present

## 2015-03-21 DIAGNOSIS — N898 Other specified noninflammatory disorders of vagina: Secondary | ICD-10-CM | POA: Diagnosis present

## 2015-03-21 DIAGNOSIS — N8189 Other female genital prolapse: Secondary | ICD-10-CM | POA: Diagnosis not present

## 2015-03-21 DIAGNOSIS — Z792 Long term (current) use of antibiotics: Secondary | ICD-10-CM | POA: Diagnosis not present

## 2015-03-21 DIAGNOSIS — N8110 Cystocele, unspecified: Secondary | ICD-10-CM | POA: Insufficient documentation

## 2015-03-21 LAB — URINALYSIS COMPLETE WITH MICROSCOPIC (ARMC ONLY)
BILIRUBIN URINE: NEGATIVE
GLUCOSE, UA: NEGATIVE mg/dL
HGB URINE DIPSTICK: NEGATIVE
KETONES UR: NEGATIVE mg/dL
LEUKOCYTES UA: NEGATIVE
NITRITE: NEGATIVE
PH: 6 (ref 5.0–8.0)
Protein, ur: NEGATIVE mg/dL
SPECIFIC GRAVITY, URINE: 1.012 (ref 1.005–1.030)

## 2015-03-21 LAB — URINE CULTURE: Special Requests: NORMAL

## 2015-03-21 NOTE — Discharge Instructions (Signed)
Return to the emergency department for any worsening condition including inability to urinate, fever, abdominal pain, or any other symptoms concerning to you.  We discussed you should follow-up with a primary care physician, and are [facility].  You should follow-up with an OB/GYN doctor, and are [facility], Dr. Tiburcio PeaHarris.  Pelvic Organ Prolapse Pelvic organ prolapse is the stretching, bulging, or dropping of pelvic organs into an abnormal position. It happens when the muscles and tissues that surround and support pelvic structures are stretched or weak. Pelvic organ prolapse can involve:  Vagina (vaginal prolapse).  Uterus (uterine prolapse).  Bladder (cystocele).  Rectum (rectocele).  Intestines (enterocele). When organs other than the vagina are involved, they often bulge into the vagina or protrude from the vagina, depending on how severe the prolapse is. CAUSES Causes of this condition include:  Pregnancy, labor, and childbirth.  Long-lasting (chronic) cough.  Chronic constipation.  Obesity.  Past pelvic surgery.  Aging. During and after menopause, a decreased production of the hormone estrogen can weaken pelvic ligaments and muscles.  Consistently lifting more than 50 lb (23 kg).  Buildup of fluid in the abdomen due to certain diseases and other conditions. SYMPTOMS Symptoms of this condition include:  Loss of bladder control when you cough, sneeze, strain, and exercise (stress incontinence). This may be worse immediately following childbirth, and it may gradually improve over time.  Feeling pressure in your pelvis or vagina. This pressure may increase when you cough or when you are having a bowel movement.  A bulge that protrudes from the opening of your vagina or against your vaginal wall. If your uterus protrudes through the opening of your vagina and rubs against your clothing, you may also experience soreness, ulcers, infection,  pain, and bleeding.  Increased effort to have a bowel movement or urinate.  Pain in your low back.  Pain, discomfort, or disinterest in sexual intercourse.  Repeated bladder infections (urinary tract infections).  Difficulty inserting or inability to insert a tampon or applicator. In some people, this condition does not cause any symptoms. DIAGNOSIS Your health care provider may perform an internal and external vaginal and rectal exam. During the exam, you may be asked to cough and strain while you are lying down, sitting, and standing up. Your health care provider will determine if other tests are required, such as bladder function tests. TREATMENT In most cases, this condition needs to be treated only if it produces symptoms. No treatment is guaranteed to correct the prolapse or relieve the symptoms completely. Treatment may include:  Lifestyle changes, such as:  Avoiding drinking beverages that contain caffeine.  Increasing your intake of high-fiber foods. This can help to decrease constipation and straining during bowel movements.  Emptying your bladder at scheduled times (bladder training therapy). This can help to reduce or avoid urinary incontinence.  Losing weight if you are overweight or obese.  Estrogen. Estrogen may help mild prolapse by increasing the strength and tone of pelvic floor muscles.  Kegel exercises. These may help mild cases of prolapse by strengthening and tightening the muscles of the pelvic floor.  Pessary insertion. A pessary is a soft, flexible device that is placed into your vagina by your health care provider to help support the vaginal walls and keep pelvic organs in place.  Surgery. This is often the only form of treatment for severe prolapse. Different types of surgeries are available. HOME CARE INSTRUCTIONS  Wear a sanitary pad or absorbent product if you  have urinary incontinence.  Avoid heavy lifting and straining with exercise and work. Do  not hold your breath when you perform mild to moderate lifting and exercise activities. Limit your activities as directed by your health care provider.  Take medicines only as directed by your health care provider.  Perform Kegel exercises as directed by your health care provider.  If you have a pessary, take care of it as directed by your health care provider. SEEK MEDICAL CARE IF:  Your symptoms interfere with your daily activities or sex life.  You need medicine to help with the discomfort.  You notice bleeding from the vagina that is not related to your period.  You have a fever.  You have pain or bleeding when you urinate.  You have bleeding when you have a bowel movement.  You lose urine when you have sex.  You have chronic constipation.  You have a pessary that falls out.  You have vaginal discharge that has a bad smell.  You have low abdominal pain or cramping that is unusual for you.   This information is not intended to replace advice given to you by your health care provider. Make sure you discuss any questions you have with your health care provider.   Document Released: 11/17/2013 Document Reviewed: 11/17/2013 Elsevier Interactive Patient Education [employer]

## 2015-03-21 NOTE — ED Notes (Signed)
Final report of urine culture shiws "insignificant growth"

## 2015-03-21 NOTE — ED Notes (Signed)
Thinks something has fallen in her vaginal area, has itching, is on 2nd round of antibiotics for uti

## 2015-03-21 NOTE — ED Provider Notes (Signed)
[facility] Emergency Department Provider Note   ____________________________________________  Time seen: 7:15 PM I have reviewed the triage vital signs and the triage nursing note.  HISTORY  Chief Complaint Vaginal Itching   Historian Patient  HPI Stephanie Cisneros is a 49 y.o. female who is here, sent from urgent care, for evaluation of "golf ball "sized object coming out of her vagina. Patient states she has noticed this over approximately the past 30 days. She has had itching and some pain associated with it. She has been treated for urinary tract infection by urgent care with 2 rounds of antibiotics over the past month.  No pain now.  History of hysterectomy.    Past Medical History  Diagnosis Date  . Hypertension   . Lupus (HCC)     There are no active problems to display for this patient.   Past Surgical History  Procedure Laterality Date  . Abdominal hysterectomy    . Tonsillectomy    . Tubal ligation      Current Outpatient Rx  Name  Route  Sig  Dispense  Refill  . acetaminophen (TYLENOL) 500 MG tablet   Oral   Take 2 tablets (1,000 mg total) by mouth every 6 (six) hours as needed for mild pain, moderate pain or headache.   30 tablet   0   . cefUROXime (CEFTIN) 500 MG tablet   Oral   Take 1 tablet (500 mg total) by mouth 2 (two) times daily with a meal.   20 tablet   0   . fluconazole (DIFLUCAN) 150 MG tablet   Oral   Take 1 tablet (150 mg total) by mouth once. May repeat in 3 days if needed   2 tablet   0   . predniSONE (DELTASONE) 50 MG tablet   Oral   Take 1 tablet (50 mg total) by mouth daily with breakfast.   5 tablet   0   . sodium chloride (OCEAN) 0.65 % SOLN nasal spray   Each Nare   Place 2 sprays into both nostrils every 2 (two) hours while awake.      0   . valsartan-hydrochlorothiazide (DIOVAN-HCT) 160-12.5 MG tablet   Oral   Take 1 tablet by mouth daily.           Allergies Review of  patient's allergies indicates no known allergies.  No family history on file.  Social History Social History  Substance Use Topics  . Smoking status: Never Smoker   . Smokeless tobacco: None  . Alcohol Use: No    Review of Systems  Constitutional: Negative for fever. Eyes: Negative for visual changes. ENT: Negative for sore throat. Cardiovascular: Negative for chest pain. Respiratory: Negative for shortness of breath. Recent episode with bronchitis, now better Gastrointestinal: Negative for abdominal pain, vomiting and diarrhea. Genitourinary: Negative for dysuria. Musculoskeletal: Negative for back pain. Skin: Negative for rash. Neurological: Negative for headache. 10 point Review of Systems otherwise negative ____________________________________________   PHYSICAL EXAM:  VITAL SIGNS: ED Triage Vitals  Enc Vitals Group     BP 03/21/15 1753 160/97 mmHg     Pulse Rate 03/21/15 1753 72     Resp 03/21/15 1753 20     Temp 03/21/15 1753 98.2 F (36.8 C)     Temp Source 03/21/15 1753 Oral     SpO2 03/21/15 1753 100 %     Weight 03/21/15 1753 296 lb (134.265 kg)     Height 03/21/15 1753  (1.727  m)     Head Cir --      Peak Flow --      Pain Score 03/21/15 1754 9     Pain Loc --      Pain Edu? --      Excl. in GC? --      Constitutional: Alert and oriented. Well appearing and in no distress. Eyes: Conjunctivae are normal. PERRL. Normal extraocular movements. ENT   Head: Normocephalic and atraumatic.   Nose: No congestion/rhinnorhea.   Mouth/Throat: Mucous membranes are moist.   Neck: No stridor. Cardiovascular/Chest: Normal rate, regular rhythm.  No murmurs, rubs, or gallops. Respiratory: Normal respiratory effort without tachypnea nor retractions. Breath sounds are clear and equal bilaterally. No wheezes/rales/rhonchi. Gastrointestinal: Soft. No distention, no guarding, no rebound. Nontender, obese.  Genitourinary/rectal: Cystocele at  introitus Musculoskeletal: Nontender with normal range of motion in all extremities. No joint effusions.  No lower extremity tenderness.  No edema. Neurologic:  Normal speech and language. No gross or focal neurologic deficits are appreciated. Skin:  Skin is warm, dry and intact. No rash noted. Psychiatric: Mood and affect are normal. Speech and behavior are normal. Patient exhibits appropriate insight and judgment.  ____________________________________________   EKG I, Governor Rooksebecca Storey Stangeland, MD, the attending physician have personally viewed and interpreted all ECGs.  No EKG performed ____________________________________________  LABS (pertinent positives/negatives)  Urinalysis negative  ____________________________________________  RADIOLOGY All Xrays were viewed by me. Imaging interpreted by Radiologist.  None __________________________________________  PROCEDURES  Procedure(s) performed: None  Critical Care performed: None  ____________________________________________   ED COURSE / ASSESSMENT AND PLAN  CONSULTATIONS: None  Pertinent labs & imaging results that were available during my care of the patient were reviewed by me and considered in my medical decision making (see chart for details).   Patient has a cystocele. I discussed need for follow-up with OB/GYN to discuss options including pessary or bladder tack surgery.  Patient has a primary care physician, so I referred her to [facility].  Patient / Family / Caregiver informed of clinical course, medical decision-making process, and agree with plan.   I discussed return precautions, follow-up instructions, and discharged instructions with patient and/or family.  ___________________________________________   FINAL CLINICAL IMPRESSION(S) / ED DIAGNOSES   Final diagnoses:  Bladder prolapse, female, acquired       Governor Rooksebecca [name], MD 03/21/15 [phone]

## 2015-03-22 ENCOUNTER — Telehealth: Payer: Self-pay | Admitting: Family Medicine

## 2015-03-22 NOTE — Telephone Encounter (Signed)
Vitamin D level not performed by lab cancelled.  Vitamin B12 normal.  Urine culture negative for bacteria growth.  Noted patient was [facility] and diagnosed with vaginal prolapse.  Unable to leave voicemail message no voicemail set up at patient home number to notify her of results.

## 2015-04-12 ENCOUNTER — Encounter: Payer: Self-pay | Admitting: Urology

## 2015-04-12 ENCOUNTER — Ambulatory Visit: Payer: Self-pay | Admitting: Urology

## 2015-05-08 IMAGING — CR DG CHEST 2V
1 series · 2 of 2 positions shown · non-contrast
Comparison: None.

CLINICAL DATA: Chest pain and fever

EXAM:
CHEST  2 VIEW

[Series 1: pa · 0.17mm/px · 2 of 2 slices shown]
[im 1/2]
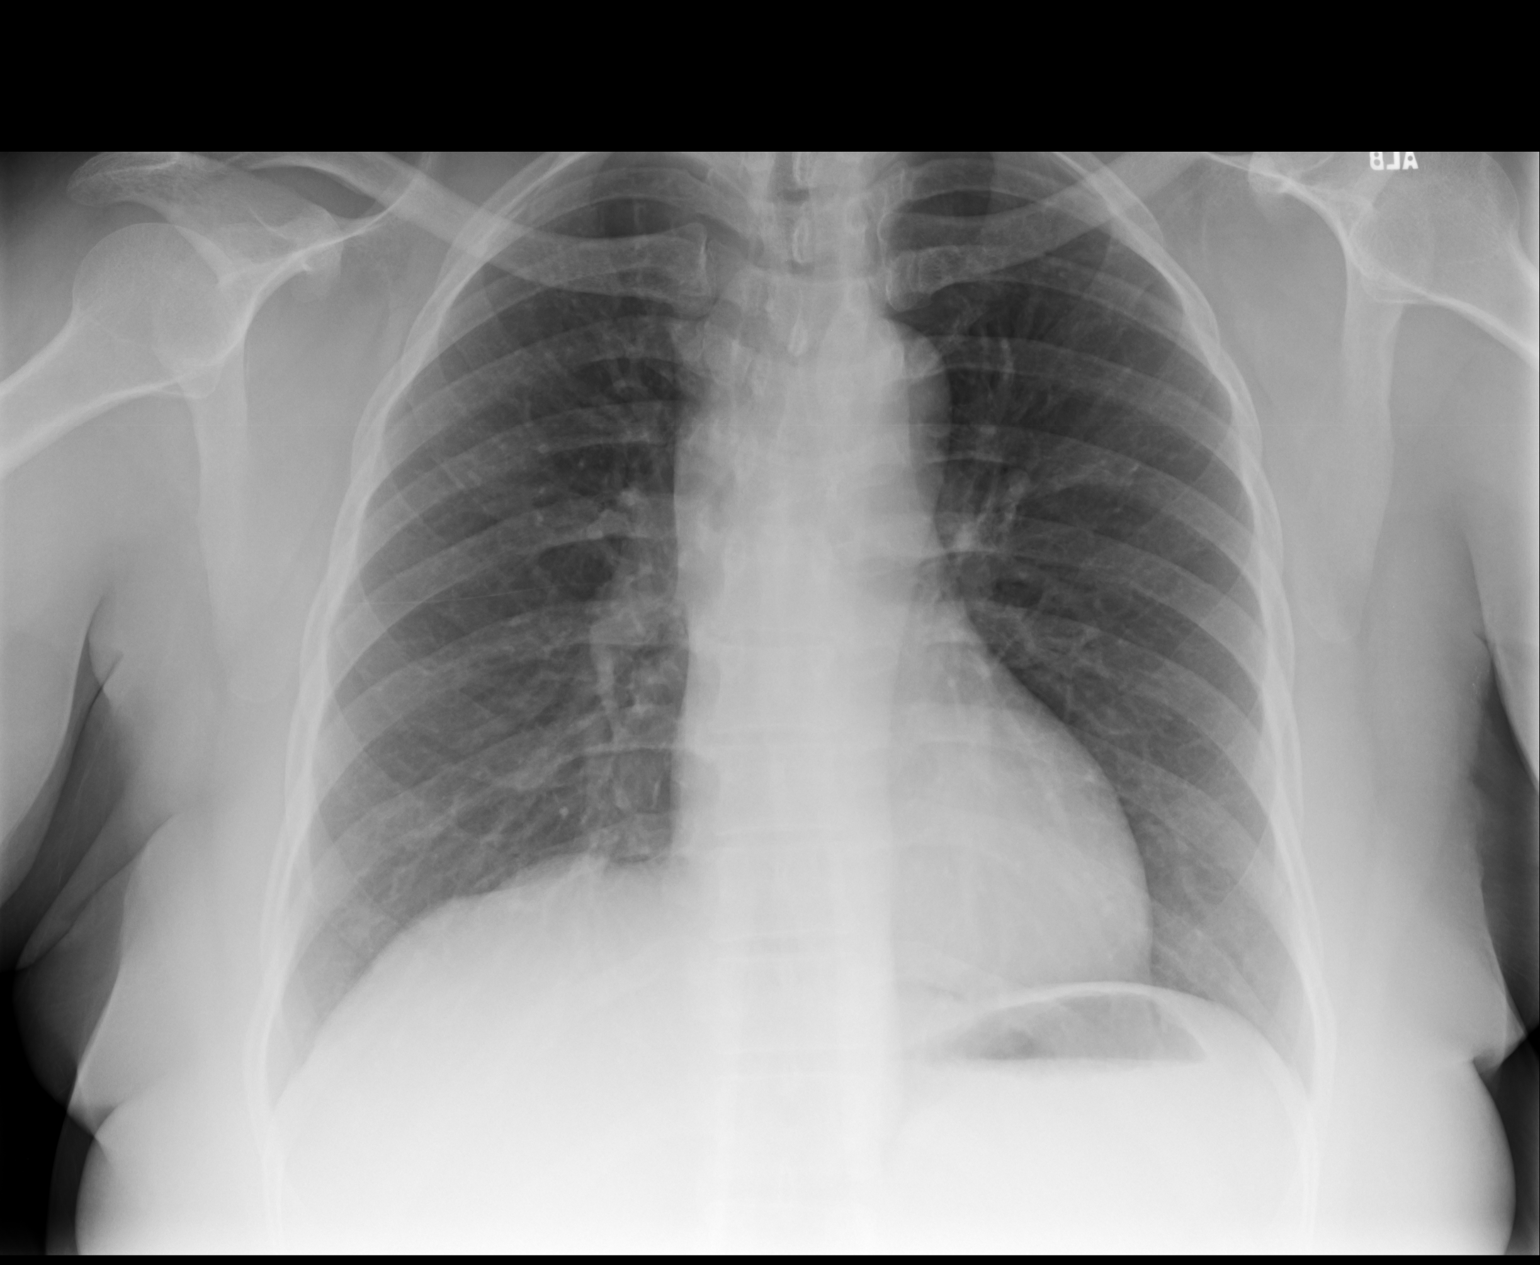
[im 2/2]
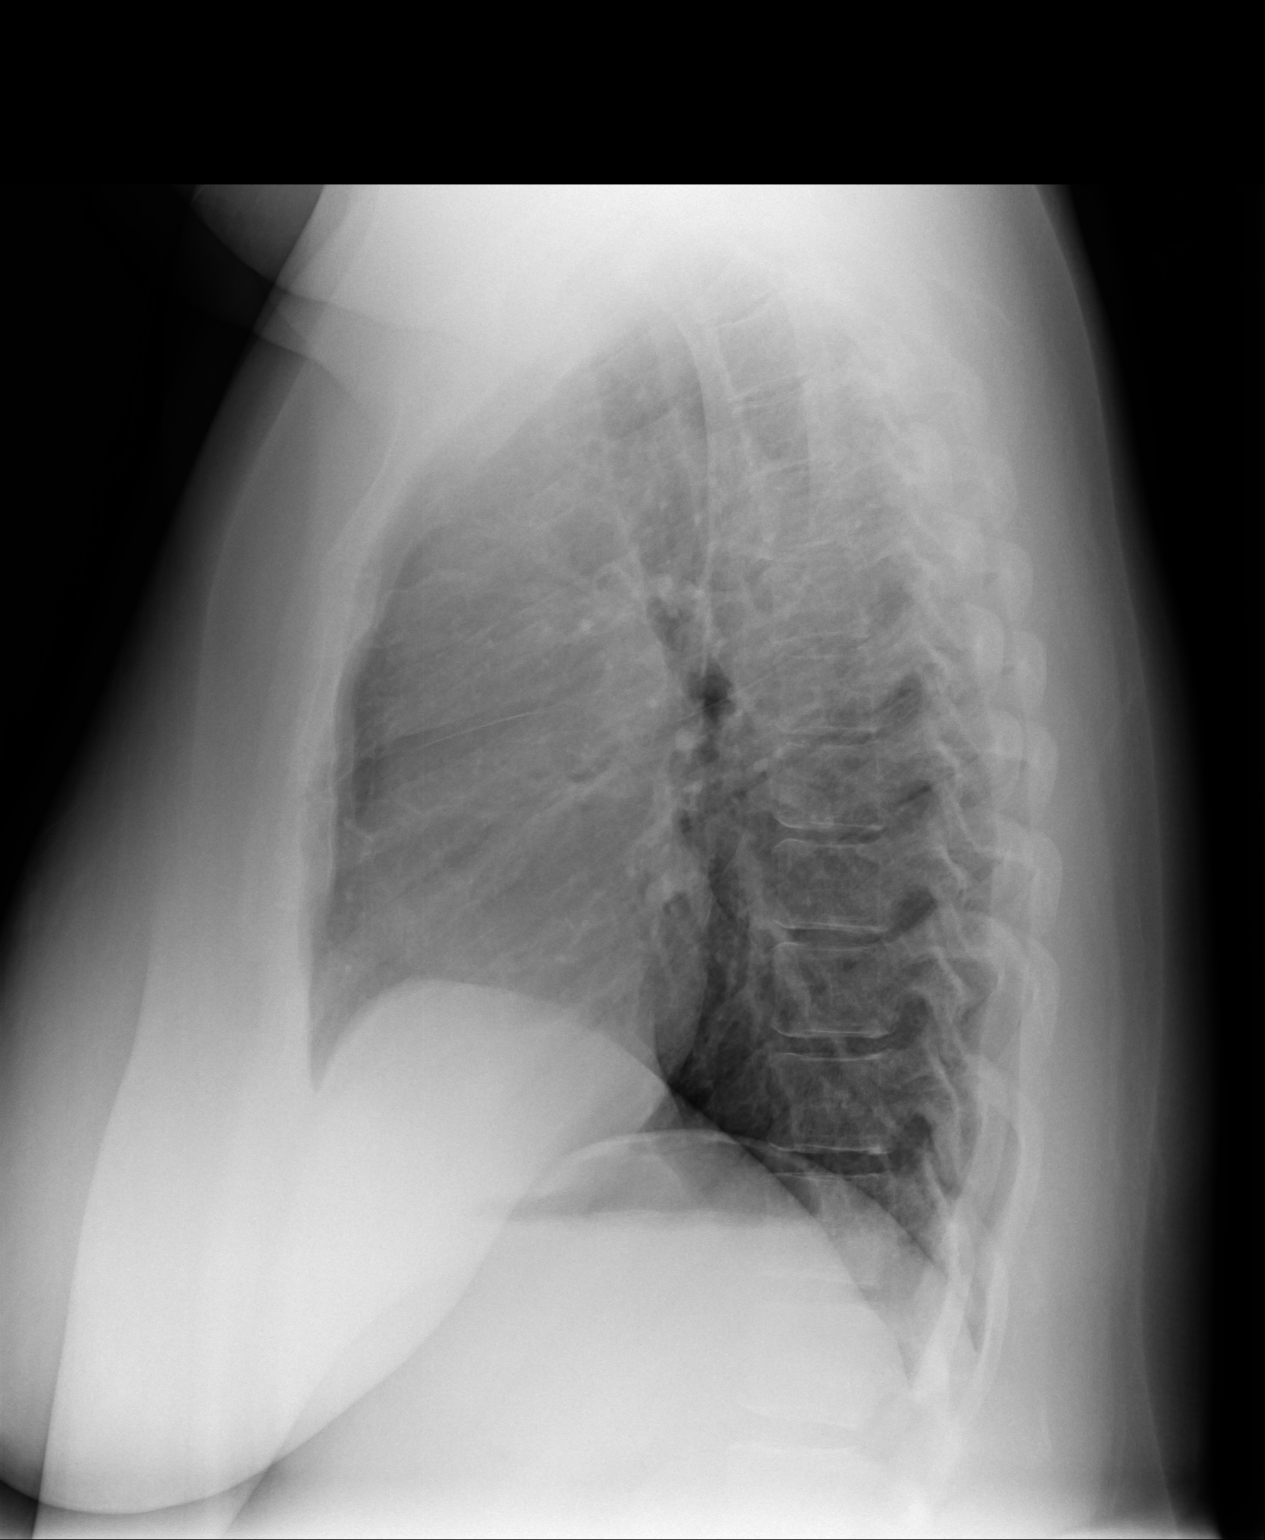

[2 of 2 positions shown; findings below may reference images not displayed]

FINDINGS: The lungs are clear. Heart size and pulmonary vascularity are
normal. No pneumothorax. No adenopathy. No bone lesions. There is
mild upper thoracic levoscoliosis.
IMPRESSION: No edema or consolidation.

## 2015-06-19 ENCOUNTER — Ambulatory Visit
Admission: EM | Admit: 2015-06-19 | Discharge: 2015-06-19 | Disposition: A | Discharge status: Other Acute Inpatient Hospital | Payer: Medicare Other | Attending: Family Medicine | Admitting: Family Medicine

## 2015-06-19 ENCOUNTER — Emergency Department: Payer: Medicare Other

## 2015-06-19 ENCOUNTER — Encounter: Payer: Self-pay | Admitting: Emergency Medicine

## 2015-06-19 ENCOUNTER — Emergency Department
Admission: EM | Admit: 2015-06-19 | Discharge: 2015-06-19 | Disposition: A | Discharge status: Home / Self Care | Payer: Medicare Other | Attending: Emergency Medicine | Admitting: Emergency Medicine

## 2015-06-19 ENCOUNTER — Ambulatory Visit: Payer: Medicare Other

## 2015-06-19 ENCOUNTER — Encounter: Payer: Self-pay | Admitting: *Deleted

## 2015-06-19 DIAGNOSIS — M549 Dorsalgia, unspecified: Secondary | ICD-10-CM | POA: Insufficient documentation

## 2015-06-19 DIAGNOSIS — R062 Wheezing: Secondary | ICD-10-CM | POA: Insufficient documentation

## 2015-06-19 DIAGNOSIS — Z79899 Other long term (current) drug therapy: Secondary | ICD-10-CM | POA: Insufficient documentation

## 2015-06-19 DIAGNOSIS — R079 Chest pain, unspecified: Secondary | ICD-10-CM | POA: Diagnosis not present

## 2015-06-19 DIAGNOSIS — R52 Pain, unspecified: Secondary | ICD-10-CM | POA: Diagnosis present

## 2015-06-19 DIAGNOSIS — I10 Essential (primary) hypertension: Secondary | ICD-10-CM | POA: Insufficient documentation

## 2015-06-19 DIAGNOSIS — J45901 Unspecified asthma with (acute) exacerbation: Secondary | ICD-10-CM | POA: Diagnosis not present

## 2015-06-19 DIAGNOSIS — M329 Systemic lupus erythematosus, unspecified: Secondary | ICD-10-CM | POA: Insufficient documentation

## 2015-06-19 DIAGNOSIS — J0100 Acute maxillary sinusitis, unspecified: Secondary | ICD-10-CM | POA: Insufficient documentation

## 2015-06-19 DIAGNOSIS — R091 Pleurisy: Secondary | ICD-10-CM | POA: Diagnosis present

## 2015-06-19 DIAGNOSIS — F329 Major depressive disorder, single episode, unspecified: Secondary | ICD-10-CM | POA: Insufficient documentation

## 2015-06-19 DIAGNOSIS — R0600 Dyspnea, unspecified: Secondary | ICD-10-CM

## 2015-06-19 DIAGNOSIS — R0602 Shortness of breath: Secondary | ICD-10-CM | POA: Diagnosis present

## 2015-06-19 DIAGNOSIS — J40 Bronchitis, not specified as acute or chronic: Secondary | ICD-10-CM

## 2015-06-19 HISTORY — DX: Unspecified asthma, uncomplicated: J45.909

## 2015-06-19 LAB — BASIC METABOLIC PANEL
Anion gap: 6 (ref 5–15)
BUN: 16 mg/dL (ref 6–20)
CALCIUM: 8.8 mg/dL — AB (ref 8.9–10.3)
CO2: 29 mmol/L (ref 22–32)
CREATININE: 0.8 mg/dL (ref 0.44–1.00)
Chloride: 100 mmol/L — ABNORMAL LOW (ref 101–111)
Glucose, Bld: 107 mg/dL — ABNORMAL HIGH (ref 65–99)
Potassium: 3.8 mmol/L (ref 3.5–5.1)
SODIUM: 135 mmol/L (ref 135–145)

## 2015-06-19 LAB — CBC WITH DIFFERENTIAL/PLATELET
BASOS PCT: 1 %
Basophils Absolute: 0.1 10*3/uL (ref 0–0.1)
EOS ABS: 0.1 10*3/uL (ref 0–0.7)
EOS PCT: 2 %
HCT: 37.5 % (ref 35.0–47.0)
HEMOGLOBIN: 11.7 g/dL — AB (ref 12.0–16.0)
Lymphocytes Relative: 37 %
Lymphs Abs: 2.1 10*3/uL (ref 1.0–3.6)
MCH: 23.1 pg — ABNORMAL LOW (ref 26.0–34.0)
MCHC: 31.3 g/dL — AB (ref 32.0–36.0)
MCV: 73.8 fL — ABNORMAL LOW (ref 80.0–100.0)
MONO ABS: 1 10*3/uL — AB (ref 0.2–0.9)
MONOS PCT: 17 %
NEUTROS PCT: 43 %
Neutro Abs: 2.3 10*3/uL (ref 1.4–6.5)
PLATELETS: 331 10*3/uL (ref 150–440)
RBC: 5.08 MIL/uL (ref 3.80–5.20)
RDW: 16.6 % — AB (ref 11.5–14.5)
WBC: 5.5 10*3/uL (ref 3.6–11.0)

## 2015-06-19 LAB — FIBRIN DERIVATIVES D-DIMER (ARMC ONLY): FIBRIN DERIVATIVES D-DIMER (ARMC): 1162 — AB (ref 0–499)

## 2015-06-19 LAB — TROPONIN I

## 2015-06-19 MED ORDER — PREDNISONE 20 MG PO TABS
40.0000 mg | ORAL_TABLET | Freq: Once | ORAL | Status: AC
  Administered 2015-06-19: 40 mg via ORAL
  Filled 2015-06-19: qty 2

## 2015-06-19 MED ORDER — AZITHROMYCIN 500 MG PO TABS
500.0000 mg | ORAL_TABLET | Freq: Once | ORAL | Status: AC
  Administered 2015-06-19: 500 mg via ORAL
  Filled 2015-06-19: qty 1

## 2015-06-19 MED ORDER — IOHEXOL 350 MG/ML SOLN
80.0000 mL | Freq: Once | INTRAVENOUS | Status: AC | PRN
  Administered 2015-06-19: 80 mL via INTRAVENOUS

## 2015-06-19 MED ORDER — HYDROCOD POLST-CPM POLST ER 10-8 MG/5ML PO SUER: 5.0000 mL | Freq: Two times a day (BID) | ORAL | Status: DC | PRN

## 2015-06-19 MED ORDER — PREDNISONE 20 MG PO TABS: 40.0000 mg | ORAL_TABLET | Freq: Every day | ORAL | Status: DC

## 2015-06-19 MED ORDER — HYDROCOD POLST-CPM POLST ER 10-8 MG/5ML PO SUER
5.0000 mL | Freq: Once | ORAL | Status: AC
  Administered 2015-06-19: 5 mL via ORAL
  Filled 2015-06-19: qty 5

## 2015-06-19 MED ORDER — AZITHROMYCIN 500 MG PO TABS: 500.0000 mg | ORAL_TABLET | Freq: Every day | ORAL | Status: AC

## 2015-06-19 MED ORDER — IPRATROPIUM-ALBUTEROL 0.5-2.5 (3) MG/3ML IN SOLN
3.0000 mL | Freq: Four times a day (QID) | RESPIRATORY_TRACT | Status: DC
  Administered 2015-06-19: 3 mL via RESPIRATORY_TRACT

## 2015-06-19 NOTE — ED Notes (Signed)
States has had chest pain and SOB x 1 week. Similar symptoms x 1 year.

## 2015-06-19 NOTE — Discharge Instructions (Signed)
Please take your medications as prescribed. Please follow-up your primary care physician 2-3 days for recheck/reevaluation. Return to the emergency department for any worsening trouble breathing, or any chest pain. As we discussed her CT scan does show enlarged lymph nodes of the chest, this is likely due to your acute bronchitis/upper respiratory infection, however we do recommend repeat chest imaging and 4-6 weeks to ensure resolution of the enlarged lymph nodes, please discuss this with your primary care physician.   Shortness of Breath Shortness of breath means you have trouble breathing. Shortness of breath needs medical care right away. HOME CARE   Do not smoke.  Avoid being around chemicals or things (paint fumes, dust) that may bother your breathing.  Rest as needed. Slowly begin your normal activities.  Only take medicines as told by your doctor.  Keep all doctor visits as told. GET HELP RIGHT AWAY IF:   Your shortness of breath gets worse.  You feel lightheaded, pass out (faint), or have a cough that is not helped by medicine.  You cough up blood.  You have pain with breathing.  You have pain in your chest, arms, shoulders, or belly (abdomen).  You have a fever.  You cannot walk up stairs or exercise the way you normally do.  You do not get better in the time expected.  You have a hard time doing normal activities even with rest.  You have problems with your medicines.  You have any new symptoms. MAKE SURE YOU:  Understand these instructions.  Will watch your condition.  Will get help right away if you are not doing well or get worse.   This information is not intended to replace advice given to you by your health care provider. Make sure you discuss any questions you have with your health care provider.   Document Released: 10/09/2007 Document Revised: 04/27/2013 Document Reviewed: 07/08/2011 Elsevier Interactive Patient Education 2016 [employer]  Upper Respiratory Infection, Adult Most upper respiratory infections (URIs) are caused by a virus. A URI affects the nose, throat, and upper air passages. The most common type of URI is often called "the common cold." HOME CARE   Take medicines only as told by your doctor.  Gargle warm saltwater or take cough drops to comfort your throat as told by your doctor.  Use a warm mist humidifier or inhale steam from a shower to increase air moisture. This may make it easier to breathe.  Drink enough fluid to keep your pee (urine) clear or pale yellow.  Eat soups and other clear broths.  Have a healthy diet.  Rest as needed.  Go back to work when your fever is gone or your doctor says it is okay.  You may need to stay home longer to avoid giving your URI to others.  You can also wear a face mask and wash your hands often to prevent spread of the virus.  Use your inhaler more if you have asthma.  Do not use any tobacco products, including cigarettes, chewing tobacco, or electronic cigarettes. If you need help quitting, ask your doctor. GET HELP IF:  You are getting worse, not better.  Your symptoms are not helped by medicine.  You have chills.  You are getting more short of breath.  You have brown or red mucus.  You have yellow or brown discharge from your nose.  You have pain in your face, especially when you bend forward.  You have a fever.  You have puffy (swollen)  neck glands.  You have pain while swallowing.  You have white areas in the back of your throat. GET HELP RIGHT AWAY IF:   You have very bad or constant:  Headache.  Ear pain.  Pain in your forehead, behind your eyes, and over your cheekbones (sinus pain).  Chest pain.  You have long-lasting (chronic) lung disease and any of the following:  Wheezing.  Long-lasting cough.  Coughing up blood.  A change in your usual mucus.  You have a stiff neck.  You have changes in  your:  Vision.  Hearing.  Thinking.  Mood. MAKE SURE YOU:   Understand these instructions.  Will watch your condition.  Will get help right away if you are not doing well or get worse.   This information is not intended to replace advice given to you by your health care provider. Make sure you discuss any questions you have with your health care provider.   Document Released: 10/09/2007 Document Revised: 09/06/2014 Document Reviewed: 07/28/2013 Elsevier Interactive Patient Education [employer]

## 2015-06-19 NOTE — ED Notes (Signed)
URI type symptoms x 1 week, cough productive- yellow

## 2015-06-19 NOTE — ED Provider Notes (Signed)
[facility]  ____________________________________________  Time seen: Approximately 3:11 PM  I have reviewed the triage vital signs and the nursing notes.   HISTORY  Chief Complaint Pleurisy; Generalized Body Aches; Cough; and Fever  HPI Stephanie Cisneros is a 50 y.o. female presents for complaints of cough and congestion 1 week. Patient reports that she has frequent sinus drainage, sinus pressure and cough. Patient states that she is frequently getting thick green drainage from blowing her nose. States getting white to green mucus out when coughing. States occasionally noticed some pink tinge to her sputum.  Patient reports accompanying wheezing and states that the wheezing is worse at night when lying down. Patient reports she continues to eat and drink well. Denies fevers.   Patient reports 5-6 days she has had tenderness to her chest and patient states that it hurts even to touch; Patient also reports chest tenderness that's present when coughing, deep breaths or moving and she has tenderness to her front of her chest as well as ribs on both sides that has been present for 5-6 days. States pain in chest increases with deep breath and radiates to back. Denies acute changes of chest discomfort or chest tenderness to palpation, and patient states that the symptoms have been present and unchanged for the last 5-6 days. Patient states the cough really keeps her up at night.  Patient presents she has a history of asthma and bronchitis. Patient states that she does not have an albuterol inhaler at home anymore.  Patient reports that she has been around several sick contacts including many of her grandchildren that have been sick with similar.   Denies current shortness of breath. Patient does report some intermittent shortness of breath at this time that is associated primarily with cough and active wheezing and chest discomfort with deep breath.   Denies palpitations, syncope, near  syncope, extremity swelling, long trips, plane ride, dizziness or weakness.  PCP: in [facility]   Past Medical History  Diagnosis Date  . Hypertension   . Lupus (HCC)   . Asthma   Depression   There are no active problems to display for this patient.   Past Surgical History  Procedure Laterality Date  . Abdominal hysterectomy    . Tonsillectomy    . Tubal ligation      Current Outpatient Rx  Name  Route  Sig  Dispense  Refill  . busPIRone (BUSPAR) 10 MG tablet   Oral   Take 10 mg by mouth 3 (three) times daily.         . valsartan-hydrochlorothiazide (DIOVAN-HCT) 160-12.5 MG tablet   Oral   Take 1 tablet by mouth daily.         . sodium chloride (OCEAN) 0.65 % SOLN nasal spray   Each Nare   Place 2 sprays into both nostrils every 2 (two) hours while awake.      0     Allergies Review of patient's allergies indicates no known allergies.   family history Denies family history of DVT or PEs. Denies clotting disorders in family. Denies any cardiac history.  Social History Social History  Substance Use Topics  . Smoking status: Never Smoker   . Smokeless tobacco: None  . Alcohol Use: No    Review of Systems Constitutional: No fever/chills Eyes: No visual changes. ENT: No sore throat. Positive cough, nasal congestion, sinus pressure, sinus drainage  Cardiovascular: Positive chest pain as above. Respiratory: Denies shortness of breath.  Gastrointestinal: No  abdominal pain.  No nausea, no vomiting.  No diarrhea.  No constipation. Genitourinary: Negative for dysuria. Musculoskeletal: Positive for back pain. Skin: Negative for rash. Neurological: Negative for headaches, focal weakness or numbness.  10-point ROS otherwise negative.  ____________________________________________   PHYSICAL EXAM:  VITAL SIGNS: ED Triage Vitals  Enc Vitals Group     BP 06/19/15 1408 150/100 mmHg     Pulse Rate 06/19/15 1408 81     Resp 06/19/15 1408 20      Temp 06/19/15 1408 98.2 F (36.8 C)     Temp Source 06/19/15 1408 Oral     SpO2 06/19/15 1408 100 %     Weight --      Height --      Head Cir --      Peak Flow --      Pain Score 06/19/15 1423 10     Pain Loc --      Pain Edu? --      Excl. in GC? --     Constitutional: Alert and oriented. Well appearing and in no acute distress. Eyes: Conjunctivae are normal. PERRL. EOMI. Head: Atraumatic. Moderate tenderness to palpation bilateral frontal maxillary sinuses. No swelling. No erythema.  Ears: no erythema, normal TMs bilaterally.   Nose: Nasal congestion bilateral nasal turbinate erythema and edema.  Mouth/Throat: Mucous membranes are moist.  Oropharynx non-erythematous. No tonsillar swelling or exudate. Neck: No stridor.  No cervical spine tenderness to palpation. Hematological/Lymphatic/Immunilogical: No cervical lymphadenopathy. Cardiovascular: Normal rate, regular rhythm. Grossly normal heart sounds.  Good peripheral circulation. Generalized anterior and bilateral lateral chest and rib tenderness to palpation, no ecchymosis, no palpable deformity.  Respiratory: Normal respiratory effort.  No retractions. Scattered inspiratory wheezes, mild scattered rhonchi; good air movement. Speaks in complete sentences. Dry intermittent cough noted in room. Ambulatory in room while speaking complete sentences. Gastrointestinal: Soft and nontender. Obese abdomen. Normal Bowel sounds.   Musculoskeletal: No lower or upper extremity tenderness nor edema. No cervical, thoracic or lumbar tenderness to palpation. Bilateral pedal pulses equal and easily palpated. No calf tenderness bilaterally. Neurologic:  Normal speech and language. No gross focal neurologic deficits are appreciated. No gait instability. Skin:  Skin is warm, dry and intact. No rash noted. Psychiatric: Mood and affect are normal. Speech and behavior are normal.   ____________________________________________   LABS (all labs ordered  are listed, but only abnormal results are displayed)  Labs Reviewed  CBC WITH DIFFERENTIAL/PLATELET - Abnormal; Notable for the following:    Hemoglobin 11.7 (*)    MCV 73.8 (*)    MCH 23.1 (*)    MCHC 31.3 (*)    RDW 16.6 (*)    Monocytes Absolute 1.0 (*)    All other components within normal limits  BASIC METABOLIC PANEL - Abnormal; Notable for the following:    Chloride 100 (*)    Glucose, Bld 107 (*)    Calcium 8.8 (*)    All other components within normal limits  FIBRIN DERIVATIVES D-DIMER ([facility] ONLY) - Abnormal; Notable for the following:    Fibrin derivatives D-dimer [facility]) 1162 (*)    All other components within normal limits  TROPONIN I    EKG ED ECG REPORT I, Renford Dills, the attending provider and Dr Thurmond Butts, personally viewed and interpreted this ECG.   Date: 06/19/2015  EKG Time: 1527  Rate: 84  Rhythm: normal EKG, normal sinus rhythm, unchanged from previous tracings  Axis: normal  Intervals:none  ST&T Change: none   RADIOLOGY  EXAM: CHEST 2 VIEW  COMPARISON: 03/19/2015  FINDINGS: Slight elevation of the right hemidiaphragm, stable. Lungs are clear. Heart is normal size. No effusions or acute bony abnormality.  IMPRESSION: No active cardiopulmonary disease.   Electronically Signed By: Charlett Nose M.D. On: 06/19/2015 15:50  I, Renford Dills, personally viewed and evaluated these images (plain radiographs) as part of my medical decision making, as well as reviewing the written report by the radiologist.    INITIAL IMPRESSION / ASSESSMENT AND PLAN / ED COURSE  Pertinent labs & imaging results that were available during my care of the patient were reviewed by me and considered in my medical decision making (see chart for details).  Well-appearing patient. No acute distress. Presents for the complaint of one week of runny nose, nasal congestion, sinus drainage, postnasal drainage, cough and wheezing. Patient also reports 5-6 days with  tenderness to her chest as well as tenderness to her chest with cough, deep breaths and movement, patient reports that chest tenderness has been constant and unchanged 5-6 days. Patient reports chest tenderness partially reproducible by palpation on exam. Reports wheezing worse at night with increased cough. States history of some similar with her asthma and bronchitis. Suspect sinusitis with bronchitis versus pneumonia. Will evaluate chest x-ray. Also due to complaints of some chest discomfort will evaluate labs evaluating troponin and d-dimer. Albuterol ipratropium neb 1 in urgent care. Discussed with patient that depending on labs patient may need to be seen in ER.  1630: Chest x-ray per radiologist no active cardio pulmonary disease. Labs reviewed. Troponin negative. D-dimer elevated. Patient also reports that albuterol ipratropium did not really improve her wheezing or shortness of breath.   As patient with continued symptoms as well as elevated d-dimer, recommend patient that she be seen in emergency room for further evaluation at this time. Patient friend at bedside. Patient alert and oriented with decisional capacity and states that she will have her friend driver. Patient refused EMS transport. Encouraged to use wheelchair instead of ambulatory at this time. Patient states that she will go to [facility] regional ER. Directed to go directly to the ER. Press photographer at [employer] called and given report. Patient stable at the time of transfer.  Discussed follow up with Primary care physician this week. Discussed follow up and return parameters including no resolution or any worsening concerns. Patient verbalized understanding and agreed to plan.   ____________________________________________   FINAL CLINICAL IMPRESSION(S) / ED DIAGNOSES  Final diagnoses:  Acute maxillary sinusitis, recurrence not specified  Wheezing  Chest pain, unspecified chest pain type      Note: This dictation was  prepared with Dragon dictation along with smaller phrase technology. Any transcriptional errors that result from this process are unintentional.    Renford Dills, NP 06/19/15 1709  Renford Dills, NP 06/19/15 1710

## 2015-06-19 NOTE — ED Provider Notes (Signed)
[facility] Emergency Department Provider Note  Time seen: 8:43 PM  I have reviewed the triage vital signs and the nursing notes.   HISTORY  Chief Complaint Shortness of Breath    HPI Stephanie Cisneros is a 50 y.o. female with a past medical history of hypertension, lupus, asthma who presents the emergency department with cough, congestion, fevers and yellow sputum for the past one week. According to the patient over the past one week she has had progressive increased shortness of breath along with cough, fever to 101 at home, and yellow sputum. She was seen at [facility] urgent care today where she had labs showing an elevated d-dimer and was referred to the emergency department for further evaluation. Patient does state occasional chest pains mostly when she coughs. Describes her chest pain is mild. Moderate when she coughs.     Past Medical History  Diagnosis Date  . Hypertension   . Lupus (HCC)   . Asthma     There are no active problems to display for this patient.   Past Surgical History  Procedure Laterality Date  . Abdominal hysterectomy    . Tonsillectomy    . Tubal ligation      Current Outpatient Rx  Name  Route  Sig  Dispense  Refill  . busPIRone (BUSPAR) 10 MG tablet   Oral   Take 10 mg by mouth 3 (three) times daily.         . sodium chloride (OCEAN) 0.65 % SOLN nasal spray   Each Nare   Place 2 sprays into both nostrils every 2 (two) hours while awake.      0   . valsartan-hydrochlorothiazide (DIOVAN-HCT) 160-12.5 MG tablet   Oral   Take 1 tablet by mouth daily.           Allergies Review of patient's allergies indicates no known allergies.  No family history on file.  Social History Social History  Substance Use Topics  . Smoking status: Never Smoker   . Smokeless tobacco: None  . Alcohol Use: No    Review of Systems Constitutional: Fever to 101 at home per patient. Cardiovascular: Chest pain with  cough. Respiratory: Positive for shortness of breath. Gastrointestinal: Negative for abdominal pain Musculoskeletal: Back pain with cough Neurological: Negative for headache 10-point ROS otherwise negative.  ____________________________________________   PHYSICAL EXAM:  VITAL SIGNS: ED Triage Vitals  Enc Vitals Group     BP 06/19/15 1717 177/82 mmHg     Pulse Rate 06/19/15 1717 86     Resp 06/19/15 1717 18     Temp 06/19/15 1717 98.1 F (36.7 C)     Temp Source 06/19/15 1717 Oral     SpO2 06/19/15 1717 100 %     Weight 06/19/15 1717 292 lb (132.45 kg)     Height 06/19/15 1717 5' 8.5" (1.74 m)     Head Cir --      Peak Flow --      Pain Score 06/19/15 1728 10     Pain Loc --      Pain Edu? --      Excl. in GC? --     Constitutional: Alert and oriented. Well appearing and in no distress. Eyes: Normal exam ENT   Head: Normocephalic and atraumatic   Mouth/Throat: Mucous membranes are moist. Cardiovascular: Normal rate, regular rhythm. No murmur Respiratory: Normal respiratory effort without tachypnea nor retractions. Breath sounds are clear. Frequent wet sounding cough throughout examination. Gastrointestinal:  Soft and nontender. No distention.  Musculoskeletal: Nontender with normal range of motion in all extremities.  Neurologic:  Normal speech and language. No gross focal neurologic deficits Skin:  Skin is warm, dry and intact.  Psychiatric: Mood and affect are normal. Speech and behavior are normal.   ____________________________________________    EKG   EKG reviewed and interpreted by myself shows normal sinus rhythm at 76 bpm, narrow QRS, normal axis, normal intervals, no ST changes. Normal EKG.  ____________________________________________    RADIOLOGY  CT angiography of the chest shows mediastinal adenopathy which could be reactive.   INITIAL IMPRESSION / ASSESSMENT AND PLAN / ED COURSE  Pertinent labs & imaging results that were available  during my care of the patient were reviewed by me and considered in my medical decision making (see chart for details).  CT scan shows mediastinal adenopathy, likely reactive given the patient's cough, congestion, fever for the past one week. I discussed these findings with the patient, we will prescribe Zithromax, prednisone, and Tussionex and have the patient follow up with her primary care physician in 2-3 days for recheck/reevaluation. I also discussed we recommend repeat imaging in 4-6 weeks to ensure resolution of her adenopathy. The patient is agreeable to plan.  Patient states last time she had bronchitis she required 500 mg Zithromax for all 5 days instead of 250 mg. We'll discharge on the 500 mg tablets.    ____________________________________________   FINAL CLINICAL IMPRESSION(S) / ED DIAGNOSES  Bronchitis Dyspnea   Minna Antis, MD 06/19/15 2119

## 2015-06-19 NOTE — Discharge Instructions (Signed)
Go directly to Emergency Room as discussed. This is very important. Use wheelchair and rest.   Cough, Adult Coughing is a reflex that clears your throat and your airways. Coughing helps to heal and protect your lungs. It is normal to cough occasionally, but a cough that happens with other symptoms or lasts a long time may be a sign of a condition that needs treatment. A cough may last only 2-3 weeks (acute), or it may last longer than 8 weeks (chronic). CAUSES Coughing is commonly caused by:  Breathing in substances that irritate your lungs.  A viral or bacterial respiratory infection.  Allergies.  Asthma.  Postnasal drip.  Smoking.  Acid backing up from the stomach into the esophagus (gastroesophageal reflux).  Certain medicines.  Chronic lung problems, including COPD (or rarely, lung cancer).  Other medical conditions such as heart failure. HOME CARE INSTRUCTIONS  Pay attention to any changes in your symptoms. Take these actions to help with your discomfort:  Take medicines only as told by your health care provider.  If you were prescribed an antibiotic medicine, take it as told by your health care provider. Do not stop taking the antibiotic even if you start to feel better.  Talk with your health care provider before you take a cough suppressant medicine.  Drink enough fluid to keep your urine clear or pale yellow.  If the air is dry, use a cold steam vaporizer or humidifier in your bedroom or your home to help loosen secretions.  Avoid anything that causes you to cough at work or at home.  If your cough is worse at night, try sleeping in a semi-upright position.  Avoid cigarette smoke. If you smoke, quit smoking. If you need help quitting, ask your health care provider.  Avoid caffeine.  Avoid alcohol.  Rest as needed. SEEK MEDICAL CARE IF:   You have new symptoms.  You cough up pus.  Your cough does not get better after 2-3 weeks, or your cough gets  worse.  You cannot control your cough with suppressant medicines and you are losing sleep.  You develop pain that is getting worse or pain that is not controlled with pain medicines.  You have a fever.  You have unexplained weight loss.  You have night sweats. SEEK IMMEDIATE MEDICAL CARE IF:  You cough up blood.  You have difficulty breathing.  Your heartbeat is very fast.   This information is not intended to replace advice given to you by your health care provider. Make sure you discuss any questions you have with your health care provider.   Document Released: 10/19/2010 Document Revised: 01/11/2015 Document Reviewed: 06/29/2014 Elsevier Interactive Patient Education 2016 [employer].   Sinusitis, Adult Sinusitis is redness, soreness, and puffiness (inflammation) of the air pockets in the bones of your face (sinuses). The redness, soreness, and puffiness can cause air and mucus to get trapped in your sinuses. This can allow germs to grow and cause an infection.  HOME CARE   Drink enough fluids to keep your pee (urine) clear or pale yellow.  Use a humidifier in your home.  Run a hot shower to create steam in the bathroom. Sit in the bathroom with the door closed. Breathe in the steam 3-4 times a day.  Put a warm, moist washcloth on your face 3-4 times a day, or as told by your doctor.  Use salt water sprays (saline sprays) to wet the thick fluid in your nose. This can help the sinuses drain.  Only take medicine as told by your doctor. GET HELP RIGHT AWAY IF:   Your pain gets worse.  You have very bad headaches.  You are sick to your stomach (nauseous).  You throw up (vomit).  You are very sleepy (drowsy) all the time.  Your face is puffy (swollen).  Your vision changes.  You have a stiff neck.  You have trouble breathing. MAKE SURE YOU:   Understand these instructions.  Will watch your condition.  Will get help right away if you are not doing well  or get worse.   This information is not intended to replace advice given to you by your health care provider. Make sure you discuss any questions you have with your health care provider.   Document Released: 10/09/2007 Document Revised: 05/13/2014 Document Reviewed: 11/26/2011 Elsevier Interactive Patient Education 2016 [employer]  Nonspecific Chest Pain  Chest pain can be caused by many different conditions. There is always a chance that your pain could be related to something serious, such as a heart attack or a blood clot in your lungs. Chest pain can also be caused by conditions that are not life-threatening. If you have chest pain, it is very important to follow up with your health care provider. CAUSES  Chest pain can be caused by:  Heartburn.  Pneumonia or bronchitis.  Anxiety or stress.  Inflammation around your heart (pericarditis) or lung (pleuritis or pleurisy).  A blood clot in your lung.  A collapsed lung (pneumothorax). It can develop suddenly on its own (spontaneous pneumothorax) or from trauma to the chest.  Shingles infection (varicella-zoster virus).  Heart attack.  Damage to the bones, muscles, and cartilage that make up your chest wall. This can include:  Bruised bones due to injury.  Strained muscles or cartilage due to frequent or repeated coughing or overwork.  Fracture to one or more ribs.  Sore cartilage due to inflammation (costochondritis). RISK FACTORS  Risk factors for chest pain may include:  Activities that increase your risk for trauma or injury to your chest.  Respiratory infections or conditions that cause frequent coughing.  Medical conditions or overeating that can cause heartburn.  Heart disease or family history of heart disease.  Conditions or health behaviors that increase your risk of developing a blood clot.  Having had chicken pox (varicella zoster). SIGNS AND SYMPTOMS Chest pain can feel like:  Burning or tingling  on the surface of your chest or deep in your chest.  Crushing, pressure, aching, or squeezing pain.  Dull or sharp pain that is worse when you move, cough, or take a deep breath.  Pain that is also felt in your back, neck, shoulder, or arm, or pain that spreads to any of these areas. Your chest pain may come and go, or it may stay constant. DIAGNOSIS Lab tests or other studies may be needed to find the cause of your pain. Your health care provider may have you take a test called an ambulatory ECG (electrocardiogram). An ECG records your heartbeat patterns at the time the test is performed. You may also have other tests, such as:  Transthoracic echocardiogram (TTE). During echocardiography, sound waves are used to create a picture of all of the heart structures and to look at how blood flows through your heart.  Transesophageal echocardiogram (TEE).This is a more advanced imaging test that obtains images from inside your body. It allows your health care provider to see your heart in finer detail.  Cardiac monitoring. This allows your  health care provider to monitor your heart rate and rhythm in real time.  Holter monitor. This is a portable device that records your heartbeat and can help to diagnose abnormal heartbeats. It allows your health care provider to track your heart activity for several days, if needed.  Stress tests. These can be done through exercise or by taking medicine that makes your heart beat more quickly.  Blood tests.  Imaging tests. TREATMENT  Your treatment depends on what is causing your chest pain. Treatment may include:  Medicines. These may include:  Acid blockers for heartburn.  Anti-inflammatory medicine.  Pain medicine for inflammatory conditions.  Antibiotic medicine, if an infection is present.  Medicines to dissolve blood clots.  Medicines to treat coronary artery disease.  Supportive care for conditions that do not require medicines. This may  include:  Resting.  Applying heat or cold packs to injured areas.  Limiting activities until pain decreases. HOME CARE INSTRUCTIONS  If you were prescribed an antibiotic medicine, finish it all even if you start to feel better.  Avoid any activities that bring on chest pain.  Do not use any tobacco products, including cigarettes, chewing tobacco, or electronic cigarettes. If you need help quitting, ask your health care provider.  Do not drink alcohol.  Take medicines only as directed by your health care provider.  Keep all follow-up visits as directed by your health care provider. This is important. This includes any further testing if your chest pain does not go away.  If heartburn is the cause for your chest pain, you may be told to keep your head raised (elevated) while sleeping. This reduces the chance that acid will go from your stomach into your esophagus.  Make lifestyle changes as directed by your health care provider. These may include:  Getting regular exercise. Ask your health care provider to suggest some activities that are safe for you.  Eating a heart-healthy diet. A registered dietitian can help you to learn healthy eating options.  Maintaining a healthy weight.  Managing diabetes, if necessary.  Reducing stress. SEEK MEDICAL CARE IF:  Your chest pain does not go away after treatment.  You have a rash with blisters on your chest.  You have a fever. SEEK IMMEDIATE MEDICAL CARE IF:   Your chest pain is worse.  You have an increasing cough, or you cough up blood.  You have severe abdominal pain.  You have severe weakness.  You faint.  You have chills.  You have sudden, unexplained chest discomfort.  You have sudden, unexplained discomfort in your arms, back, neck, or jaw.  You have shortness of breath at any time.  You suddenly start to sweat, or your skin gets clammy.  You feel nauseous or you vomit.  You suddenly feel light-headed or  dizzy.  Your heart begins to beat quickly, or it feels like it is skipping beats. These symptoms may represent a serious problem that is an emergency. Do not wait to see if the symptoms will go away. Get medical help right away. Call your local emergency services (911 in the [place] Do not drive yourself to the hospital.   This information is not intended to replace advice given to you by your health care provider. Make sure you discuss any questions you have with your health care provider.   Document Released: 01/30/2005 Document Revised: 05/13/2014 Document Reviewed: 11/26/2013 Elsevier Interactive Patient Education [employer]

## 2015-12-05 ENCOUNTER — Other Ambulatory Visit: Payer: Self-pay | Admitting: Family Medicine

## 2015-12-05 ENCOUNTER — Ambulatory Visit
Admission: EM | Admit: 2015-12-05 | Discharge: 2015-12-05 | Disposition: A | Discharge status: Home / Self Care | Payer: Medicare Other | Attending: Family Medicine | Admitting: Family Medicine

## 2015-12-05 ENCOUNTER — Ambulatory Visit
Admission: RE | Admit: 2015-12-05 | Discharge: 2015-12-05 | Disposition: A | Discharge status: Home / Self Care | Payer: Medicare Other | Source: Ambulatory Visit | Attending: Emergency Medicine | Admitting: Emergency Medicine

## 2015-12-05 DIAGNOSIS — M79605 Pain in left leg: Secondary | ICD-10-CM | POA: Diagnosis not present

## 2015-12-05 DIAGNOSIS — M79609 Pain in unspecified limb: Secondary | ICD-10-CM | POA: Diagnosis not present

## 2015-12-05 DIAGNOSIS — M5481 Occipital neuralgia: Secondary | ICD-10-CM | POA: Diagnosis not present

## 2015-12-05 MED ORDER — NAPROXEN 500 MG PO TABS: 500.0000 mg | ORAL_TABLET | Freq: Two times a day (BID) | ORAL | 0 refills | Status: AC

## 2015-12-05 NOTE — ED Notes (Signed)
Venous Ultrasound scheduled for today at 4:30pm at [facility]

## 2015-12-05 NOTE — ED Provider Notes (Signed)
CSN: [id number]     Arrival date & time 12/05/15  1049 History   First MD Initiated Contact with Patient 12/05/15 1138     Chief Complaint  Patient presents with  . Migraine  . Sore Throat   (Consider location/radiation/quality/duration/timing/severity/associated sxs/prior Treatment) HPI  This a 50 year old female who presents with multiple complaints. She states that she's had head pain indicating the right occipital area and frontal headache. Status post sore throat pain in her back along the paraspinous muscles intrascapular more towards the left a sore throat and frequent cramping. Does not have a primary care physician as she was just fired by her last one. She also has a history of a positive d-dimer and eventually underwent CT angiogram of her chest which did not show any PE. However today she is very concerned that her left leg pain is the beginning of a new clot that was possibly missed last time.  Past Medical History:  Diagnosis Date  . Asthma   . Hypertension   . Lupus [facility])    Past Surgical History:  Procedure Laterality Date  . ABDOMINAL HYSTERECTOMY    . TONSILLECTOMY    . TUBAL LIGATION     History reviewed. No pertinent family history. Social History  Substance Use Topics  . Smoking status: Never Smoker  . Smokeless tobacco: Never Used  . Alcohol use No   OB History    No data available     Review of Systems  Constitutional: Positive for activity change. Negative for chills, fatigue and fever.  HENT: Positive for sore throat.   Musculoskeletal: Positive for back pain, myalgias and neck pain.  All other systems reviewed and are negative.   Allergies  Review of patient's allergies indicates no known allergies.  Home Medications   Prior to Admission medications   Medication Sig Start Date End Date Taking? Authorizing Provider  valsartan-hydrochlorothiazide (DIOVAN-HCT) 160-12.5 MG tablet Take 1 tablet by mouth daily.   Yes Historical Provider, MD   busPIRone (BUSPAR) 10 MG tablet Take 10 mg by mouth 3 (three) times daily.    Historical Provider, MD  naproxen (NAPROSYN) 500 MG tablet Take 1 tablet (500 mg total) by mouth 2 (two) times daily. 12/05/15   Lutricia Feil, PA-C  sodium chloride (OCEAN) 0.65 % SOLN nasal spray Place 2 sprays into both nostrils every 2 (two) hours while awake. 03/19/15   Barbaraann Barthel, NP   Meds Ordered and Administered this Visit  Medications - No data to display  BP (!) 156/112 (BP Location: Right Arm)   Pulse 73   Temp 98 F (36.7 C) (Oral)   Resp 18   Ht 5\' 8"  (1.727 m)   Wt 286 lb (129.7 kg)   SpO2 100%   BMI 43.49 kg/m  No data found.   Physical Exam  Constitutional: She is oriented to person, place, and time. She appears well-developed and well-nourished. No distress.  HENT:  Head: Normocephalic and atraumatic.  Right Ear: External ear normal.  Left Ear: External ear normal.  Mouth/Throat: Oropharynx is clear and moist.  Eyes: EOM are normal. Pupils are equal, round, and reactive to light. Right eye exhibits no discharge. Left eye exhibits no discharge. No scleral icterus.  Neck: Normal range of motion. Neck supple.  Patient has tenderness over the right occipital nerve the bases skull is not present on the left.  Pulmonary/Chest: Effort normal and breath sounds normal. No respiratory distress. She has no wheezes. She has no rales. She  exhibits tenderness.  Musculoskeletal: Normal range of motion. She exhibits edema and tenderness.  X emanation of the left knee shows good range of motion. Collateral ligaments are intact to stressing. No evidence of effusion. Patient is extremely obese and deep palpation is difficult. He does have tenderness in the popliteal fossa and superior to that in the midline. There is some warmth but not appreciable in comparison to the right.  Lymphadenopathy:    She has no cervical adenopathy.  Neurological: She is alert and oriented to person, place, and time.   Skin: Skin is warm and dry. She is not diaphoretic. No erythema.  Psychiatric: She has a normal mood and affect. Her behavior is normal. Judgment and thought content normal.  Nursing note and vitals reviewed.   Urgent Care Course   Clinical Course    Procedures (including critical care time)  Labs Review Labs Reviewed - No data to display  Imaging Review US Venous Img Lower Unilateral Left  Result Date: 12/05/2015 CLINICAL DATA:  Left lower extremity pain and edema. History of smoking and varicose veins. Evaluate for DVT. EXAM: LEFT LOWER EXTREMITY VENOUS DOPPLER ULTRASOUND TECHNIQUE: Gray-scale sonography with graded compression, as well as color Doppler and duplex ultrasound were performed to evaluate the lower extremity deep venous systems from the level of the common femoral vein and including the common femoral, femoral, profunda femoral, popliteal and calf veins including the posterior tibial, peroneal and gastrocnemius veins when visible. The superficial great saphenous vein was also interrogated. Spectral Doppler was utilized to evaluate flow at rest and with distal augmentation maneuvers in the common femoral, femoral and popliteal veins. COMPARISON:  None. FINDINGS: Contralateral Common Femoral Vein: Respiratory phasicity is normal and symmetric with the symptomatic side. No evidence of thrombus. Normal compressibility. Common Femoral Vein: No evidence of thrombus. Normal compressibility, respiratory phasicity and response to augmentation. Saphenofemoral Junction: No evidence of thrombus. Normal compressibility and flow on color Doppler imaging. Profunda Femoral Vein: No evidence of thrombus. Normal compressibility and flow on color Doppler imaging. Femoral Vein: No evidence of thrombus. Normal compressibility, respiratory phasicity and response to augmentation. Popliteal Vein: No evidence of thrombus. Normal compressibility, respiratory phasicity and response to augmentation. Calf  Veins: No evidence of thrombus. Normal compressibility and flow on color Doppler imaging. Superficial Great Saphenous Vein: No evidence of thrombus. Normal compressibility and flow on color Doppler imaging. Venous Reflux:  None. Other Findings:  None. IMPRESSION: No evidence of DVT within the left lower extremity. Electronically Signed   By: Simonne Come M.D.   On: 12/05/2015 17:15     Visual Acuity Review  Right Eye Distance:   Left Eye Distance:   Bilateral Distance:    Right Eye Near:   Left Eye Near:    Bilateral Near:         MDM   1. Popliteal pain   2. Occipital neuralgia of right side    Discharge Medication List as of 12/05/2015 12:35 PM    START taking these medications   Details  naproxen (NAPROSYN) 500 MG tablet Take 1 tablet (500 mg total) by mouth 2 (two) times daily., Starting Tue 12/05/2015, Normal      Plan: 1. Test/x-ray results and diagnosis reviewed with patient 2. rx as per orders; risks, benefits, potential side effects reviewed with patient 3. Recommend supportive treatment with Naprosyn for the occipital neuralgia and headache. I've told her that she will need to find a primary care physician that can provide a better workup. I have  given her the name of Dr. Hollace Hayward as a possible of primary care physician. Since she had a positive d-dimer in the past but a negative CT angiogram I will order a venous Doppler ultrasound of the left leg to rule out DVT. She is so obese it is very difficult to do an adequate exam. The venous Doppler ultrasound provide [place] a definitive  Answer. 4. F/u prn if symptoms worsen or don't improve  Addendum: 12/05/2015 notified from [facility] ultrasound that the venous Doppler ultrasound was negative. Nothing in the popliteal fossa that was abnormal. Spoke with the patient on the phone and relayed the message to her told her to take the Naprosyn that I provided for her and that she should follow-up with a primary care physician.   Lutricia Feil, PA-C 12/05/15 2101

## 2015-12-05 NOTE — ED Triage Notes (Signed)
Patient complains of headache, sore throat, back pain and leg pain. She says the headache has been going on for a few days but the back and leg pain are new.

## 2016-01-28 ENCOUNTER — Ambulatory Visit
Admission: EM | Admit: 2016-01-28 | Discharge: 2016-01-28 | Disposition: A | Discharge status: Home / Self Care | Payer: Medicare Other | Attending: Family Medicine | Admitting: Family Medicine

## 2016-01-28 ENCOUNTER — Encounter: Payer: Self-pay | Admitting: Gynecology

## 2016-01-28 DIAGNOSIS — Z79899 Other long term (current) drug therapy: Secondary | ICD-10-CM | POA: Diagnosis not present

## 2016-01-28 DIAGNOSIS — J0110 Acute frontal sinusitis, unspecified: Secondary | ICD-10-CM | POA: Diagnosis not present

## 2016-01-28 DIAGNOSIS — N760 Acute vaginitis: Secondary | ICD-10-CM

## 2016-01-28 DIAGNOSIS — N898 Other specified noninflammatory disorders of vagina: Secondary | ICD-10-CM | POA: Diagnosis present

## 2016-01-28 LAB — URINALYSIS COMPLETE WITH MICROSCOPIC (ARMC ONLY)
BILIRUBIN URINE: NEGATIVE
Bacteria, UA: NONE SEEN
GLUCOSE, UA: NEGATIVE mg/dL
Ketones, ur: NEGATIVE mg/dL
NITRITE: NEGATIVE
PH: 6 (ref 5.0–8.0)
Protein, ur: NEGATIVE mg/dL
RBC / HPF: NONE SEEN RBC/hpf (ref 0–5)
SPECIFIC GRAVITY, URINE: 1.025 (ref 1.005–1.030)

## 2016-01-28 LAB — WET PREP, GENITAL
Clue Cells Wet Prep HPF POC: NONE SEEN
SPERM: NONE SEEN
Trich, Wet Prep: NONE SEEN
YEAST WET PREP: NONE SEEN

## 2016-01-28 LAB — CHLAMYDIA/NGC RT PCR (ARMC ONLY)
Chlamydia Tr: NOT DETECTED
N GONORRHOEAE: NOT DETECTED

## 2016-01-28 MED ORDER — FLUCONAZOLE 150 MG PO TABS: 150.0000 mg | ORAL_TABLET | Freq: Every day | ORAL | 0 refills | Status: AC

## 2016-01-28 MED ORDER — AMOXICILLIN-POT CLAVULANATE 875-125 MG PO TABS: 1.0000 | ORAL_TABLET | Freq: Two times a day (BID) | ORAL | 0 refills | Status: AC

## 2016-01-28 NOTE — ED Provider Notes (Signed)
[facility] ____________________________________________  Time seen: Approximately 9:02 AM  I have reviewed the triage vital signs and the nursing notes.   HISTORY  Chief Complaint Vaginal Discharge; Conjunctivitis; and Sinusitis   HPI Stephanie Cisneros is a 50 y.o. female presents for multiple medical complaints including vaginal discharge and vaginal itching and nasal congestion. Patient reports that she has had vaginal discharge that is been a whitish discharge with vaginal itching for the last 4 days. Patient denies vaginal or pelvic pain. States mild vaginal burning sensation when urinating.Patient does report she had one episode of after urinating wiping and having a small amount appearance of blood on the tissue. Reports sexually active with 1 partner. Denies concerns of STDs. Denies other bleeding. Patient reports she has had a history of vaginal yeast infections in the past that were similar.  Patient also reports the last week to week and a half she has had runny nose, nasal congestion and sinus pressure. Patient also reports the last few days she's had bilateral eyes watering with occasional crusting in the morning. Patient reports feeling blowing her nose and thick drainage out. States occasional cough. Reports has not been taking any medications for the same complaints.  Denies chest pain, shortness of breath, abdominal pain, change in chronic low back pain, extremity pain, actually swelling, headache, dizziness, vision changes, rash or lesions. Denies recent antibiotic use. Denies recent sickness.  PCP: [facility]    Past Medical History:  Diagnosis Date  . Asthma   . Hypertension   . Lupus (HCC)     There are no active problems to display for this patient.   Past Surgical History:  Procedure Laterality Date  . ABDOMINAL HYSTERECTOMY    . TONSILLECTOMY    . TUBAL LIGATION      Current Outpatient Rx  . Order #: [id number] Class: Historical Med  .  Order #: [id number] Class: Normal  . Order #: [id number] Class: OTC  . Order #: [id number] Class: Historical Med  . Order #: [id number] Class: Normal  . Order #: [id number] Class: Normal    No current facility-administered medications for this encounter.   Current Outpatient Prescriptions:  .  busPIRone (BUSPAR) 10 MG tablet, Take 10 mg by mouth 3 (three) times daily., Disp: , Rfl:  .  naproxen (NAPROSYN) 500 MG tablet, Take 1 tablet (500 mg total) by mouth 2 (two) times daily., Disp: 30 tablet, Rfl: 0 .  sodium chloride (OCEAN) 0.65 % SOLN nasal spray, Place 2 sprays into both nostrils every 2 (two) hours while awake., Disp: , Rfl: 0 .  valsartan-hydrochlorothiazide (DIOVAN-HCT) 160-12.5 MG tablet, Take 1 tablet by mouth daily., Disp: , Rfl:  .  amoxicillin-clavulanate (AUGMENTIN) 875-125 MG tablet, Take 1 tablet by mouth every 12 (twelve) hours., Disp: 20 tablet, Rfl: 0 .  fluconazole (DIFLUCAN) 150 MG tablet, Take 1 tablet (150 mg total) by mouth daily. Take one pill orally, then Repeat in one week as needed., Disp: 2 tablet, Rfl: 0  Allergies Review of patient's allergies indicates no known allergies.  No family history on file.  Social History Social History  Substance Use Topics  . Smoking status: Never Smoker  . Smokeless tobacco: Never Used  . Alcohol use No    Review of Systems Constitutional: No fever/chills Eyes: No visual changes. ENT: No sore throat.As above. Cardiovascular: Denies chest pain. Respiratory: Denies shortness of breath. Gastrointestinal: No abdominal pain.  No nausea, no vomiting.  No diarrhea.  No constipation. Genitourinary: As above. Musculoskeletal: Negative for back pain. Skin: Negative  for rash. Neurological: Negative for headaches, focal weakness or numbness.  10-point ROS otherwise negative.  ____________________________________________   PHYSICAL EXAM:  VITAL SIGNS: ED Triage Vitals  Enc Vitals Group     BP 01/28/16 0827 (!) 152/85      Pulse Rate 01/28/16 0827 70     Resp 01/28/16 0827 18     Temp 01/28/16 0827 (!) 95 F (35 C)     Temp Source 01/28/16 0827 Tympanic     SpO2 01/28/16 0827 100 %     Weight 01/28/16 0827 296 lb (134.3 kg)     Height 01/28/16 0827 5\' 8"  (1.727 m)     Head Circumference --      Peak Flow --      Pain Score 01/28/16 0831 9     Pain Loc --      Pain Edu? --      Excl. in GC? --     Constitutional: Alert and oriented. Well appearing and in no acute distress. Eyes: Conjunctivae are normal.No exudate, injection or drainage bilaterally. PERRL. EOMI. No surrounding erythema, tenderness or swelling bilaterally. Head: Atraumatic.Mild to moderate tenderness to palpation bilateral frontal and minimal tenderness to bilateral maxillary sinuses. No swelling. No erythema.   Ears: no erythema, normal TMs bilaterally.   Nose: nasal congestion with bilateral nasal turbinate erythema and edema.   Mouth/Throat: Mucous membranes are moist.  Oropharynx non-erythematous.No tonsillar swelling or exudate.  Neck: No stridor.  No cervical spine tenderness to palpation. Hematological/Lymphatic/Immunilogical: No cervical lymphadenopathy. Cardiovascular: Normal rate, regular rhythm. Grossly normal heart sounds.  Good peripheral circulation. Respiratory: Normal respiratory effort.  No retractions. Lungs CTAB. No wheezes, rales or rhonchi. Good air movement.  Gastrointestinal: Soft and nontender. Obese abdomen. No CVA tenderness. Pelvic: Pelvic exam completed with Durward Mallard CMA at bedside. External: Normal appearance, no rash or lesions. Speculum: Mild amount of whitish discharge. No bleeding, no dried blood, no prolapse visible, cervix surgically absent. Bimanual: Nontender, no adnexal tenderness. Musculoskeletal: No lower or upper extremity tenderness nor edema.  Bilateral pedal pulses equal and easily palpated. No cervical, thoracic or lumbar tenderness to palpation.  Neurologic:  Normal speech and language. No gross  focal neurologic deficits are appreciated. No gait instability. Skin:  Skin is warm, dry and intact. No rash noted. Psychiatric: Mood and affect are normal. Speech and behavior are normal.  ___________________________________________   LABS (all labs ordered are listed, but only abnormal results are displayed)  Labs Reviewed  WET PREP, GENITAL - Abnormal; Notable for the following:       Result Value   WBC, Wet Prep HPF POC FEW (*)    All other components within normal limits  URINALYSIS COMPLETEWITH MICROSCOPIC ([facility] ONLY) - Abnormal; Notable for the following:    Hgb urine dipstick TRACE (*)    Leukocytes, UA TRACE (*)    Squamous Epithelial / LPF 6-30 (*)    All other components within normal limits  CHLAMYDIA/NGC RT PCR ([facility] ONLY)  URINE CULTURE   ____________________________________________   PROCEDURES Procedures    INITIAL IMPRESSION / ASSESSMENT AND PLAN / ED COURSE  Pertinent labs & imaging results that were available during my care of the patient were reviewed by me and considered in my medical decision making (see chart for details).  Very well-appearing patient. No acute distress. Present for the complaints of vaginal discharge, vaginal itching as well as sinus and eye complaints. Suspect sinusitis and viral conjunctivitis. Discussed with patient lab results and urine including trace HgB  in urine. Suspect vaginitis. As patient with previous yeast infections with similar presentation will treat patient with oral Diflucan. Counseled with patient to follow-up closely with primary care physician for follow-up.Discussed indication, risks and benefits of medications with patient.  Discussed follow up with Primary care physician this week. Discussed follow up and return parameters including no resolution or any worsening concerns. Patient verbalized understanding and agreed to plan.   ____________________________________________   FINAL CLINICAL IMPRESSION(S)  / ED DIAGNOSES  Final diagnoses:  Vaginitis  Acute frontal sinusitis, recurrence not specified     Discharge Medication List as of 01/28/2016  9:54 AM    START taking these medications   Details  amoxicillin-clavulanate (AUGMENTIN) 875-125 MG tablet Take 1 tablet by mouth every 12 (twelve) hours., Starting Sun 01/28/2016, Normal    fluconazole (DIFLUCAN) 150 MG tablet Take 1 tablet (150 mg total) by mouth daily. Take one pill orally, then Repeat in one week as needed., Starting Sun 01/28/2016, Normal        Note: This dictation was prepared with Dragon dictation along with smaller phrase technology. Any transcriptional errors that result from this process are unintentional.    Clinical Course      Renford Dills, NP 01/28/16 1328

## 2016-01-28 NOTE — Discharge Instructions (Signed)
Take medication as prescribed. Rest. Drink plenty of fluids.  ° °Follow up with your primary care physician this week. Return to Urgent care for new or worsening concerns.  ° °

## 2016-01-28 NOTE — ED Triage Notes (Signed)
Patient c/o vaginal discharge and when wiping blood on tissue. Patient also c/o bilateral eye drainage/ mucous and redness. Patient also c/o sinusitis.

## 2016-01-30 ENCOUNTER — Telehealth: Payer: Self-pay | Admitting: *Deleted

## 2016-01-30 NOTE — Telephone Encounter (Signed)
Called patient and informed her that her chlamydia and gonorrhea tests resulted negative. Patient confirmed understanding of tests results.

## 2016-02-20 ENCOUNTER — Ambulatory Visit: Payer: Medicare Other

## 2016-02-20 ENCOUNTER — Ambulatory Visit
Admission: EM | Admit: 2016-02-20 | Discharge: 2016-02-20 | Disposition: A | Discharge status: Home / Self Care | Payer: Medicare Other | Attending: Family Medicine | Admitting: Family Medicine

## 2016-02-20 DIAGNOSIS — Z90710 Acquired absence of both cervix and uterus: Secondary | ICD-10-CM | POA: Insufficient documentation

## 2016-02-20 DIAGNOSIS — M25551 Pain in right hip: Secondary | ICD-10-CM | POA: Diagnosis not present

## 2016-02-20 DIAGNOSIS — M25561 Pain in right knee: Secondary | ICD-10-CM | POA: Insufficient documentation

## 2016-02-20 DIAGNOSIS — M79671 Pain in right foot: Secondary | ICD-10-CM | POA: Diagnosis not present

## 2016-02-20 DIAGNOSIS — S93401A Sprain of unspecified ligament of right ankle, initial encounter: Secondary | ICD-10-CM

## 2016-02-20 DIAGNOSIS — Z79899 Other long term (current) drug therapy: Secondary | ICD-10-CM | POA: Diagnosis not present

## 2016-02-20 MED ORDER — MELOXICAM 15 MG PO TABS: 15.0000 mg | ORAL_TABLET | Freq: Every day | ORAL | 0 refills | Status: AC | PRN

## 2016-02-20 NOTE — ED Triage Notes (Signed)
Patient c/o right foot, knee and leg pain, She stepped on a high heel shoe last night and twisted her foot and knee.

## 2016-02-20 NOTE — Discharge Instructions (Signed)
Take medication as prescribed. Rest. Apply ice and elevate.   Follow up with your primary care physician or orthopedic this week as needed. Return to Urgent care for new or worsening concerns.

## 2016-02-20 NOTE — ED Provider Notes (Signed)
[facility] ____________________________________________  Time seen: Approximately 5:28 PM  I have reviewed the triage vital signs and the nursing notes.   HISTORY  Chief Complaint Foot Pain (right)   HPI Stephanie Cisneros is a 50 y.o. female presents with a complaint of right knee, foot and ankle pain post injury. Patient reports that last night she accidentally stepped on a high school shoe in her closet causing her to roll her right ankle and twist her right knee. Patient reports that she did not fully fall to the ground. Patient reports her friend caught her. Denies direct trauma. Denies head injury or loss consciousness. Patient reports she has remained ambulatory with pain to right knee and right foot. States pain is described as a burning sore sensation that is moderate when walking. Denies any paresthesias, pain radiation, bruising or swelling. Patient reports that she has applied ice and elevated, but is not taking any medications for the same.  Denies other complaints. Patient reports she feels well otherwise. Reports history of injury to right ankle. Patient reports that she rolled her ankle and because of stopping shoe awkwardly.  No LMP recorded. Patient has had a hysterectomy.    Past Medical History:  Diagnosis Date  . Asthma   . Hypertension   . Lupus     There are no active problems to display for this patient.   Past Surgical History:  Procedure Laterality Date  . ABDOMINAL HYSTERECTOMY    . TONSILLECTOMY    . TUBAL LIGATION      Current Outpatient Rx  . Order #: [id number] Class: Historical Med  . Order #: [id number] Class: Historical Med  . Order #: [id number] Class: Normal  . Order #: [id number] Class: Normal  . Order #: [id number] Class: Normal  . Order #: [id number] Class: Normal  . Order #: [id number] Class: OTC    No current facility-administered medications for this encounter.   Current Outpatient Prescriptions:  .  busPIRone (BUSPAR) 10  MG tablet, Take 10 mg by mouth 3 (three) times daily., Disp: , Rfl:  .  valsartan-hydrochlorothiazide (DIOVAN-HCT) 160-12.5 MG tablet, Take 1 tablet by mouth daily., Disp: , Rfl:  .  amoxicillin-clavulanate (AUGMENTIN) 875-125 MG tablet, Take 1 tablet by mouth every 12 (twelve) hours., Disp: 20 tablet, Rfl: 0 .  fluconazole (DIFLUCAN) 150 MG tablet, Take 1 tablet (150 mg total) by mouth daily. Take one pill orally, then Repeat in one week as needed., Disp: 2 tablet, Rfl: 0 .  meloxicam (MOBIC) 15 MG tablet, Take 1 tablet (15 mg total) by mouth daily as needed for pain., Disp: 10 tablet, Rfl: 0 .  naproxen (NAPROSYN) 500 MG tablet, Take 1 tablet (500 mg total) by mouth 2 (two) times daily., Disp: 30 tablet, Rfl: 0 .  sodium chloride (OCEAN) 0.65 % SOLN nasal spray, Place 2 sprays into both nostrils every 2 (two) hours while awake., Disp: , Rfl: 0  Allergies Review of patient's allergies indicates no known allergies.  History reviewed. No pertinent family history.  Social History Social History  Substance Use Topics  . Smoking status: Never Smoker  . Smokeless tobacco: Never Used  . Alcohol use No    Review of Systems Constitutional: No fever/chills Eyes: No visual changes. ENT: No sore throat. Cardiovascular: Denies chest pain. Respiratory: Denies shortness of breath. Gastrointestinal: No abdominal pain.  No nausea, no vomiting.  No diarrhea.  No constipation. Genitourinary: Negative for dysuria. Musculoskeletal: Negative for back pain. As above Skin: Negative for rash. Neurological: Negative for headaches, focal weakness or  numbness.  10-point ROS otherwise negative.  ____________________________________________   PHYSICAL EXAM:  VITAL SIGNS: ED Triage Vitals  Enc Vitals Group     BP 02/20/16 1652 (!) 155/91     Pulse Rate 02/20/16 1652 71     Resp 02/20/16 1652 18     Temp 02/20/16 1652 98 F (36.7 C)     Temp Source 02/20/16 1652 Oral     SpO2 02/20/16 1652 97 %      Weight 02/20/16 1651 296 lb (134.3 kg)     Height 02/20/16 1651 5\' 8"  (1.727 m)     Head Circumference --      Peak Flow --      Pain Score 02/20/16 1652 10     Pain Loc --      Pain Edu? --      Excl. in GC? --     Constitutional: Alert and oriented. Well appearing and in no acute distress. Eyes: Conjunctivae are normal. PERRL. EOMI. ENT      Head: Normocephalic and atraumatic.      Nose: No congestion/rhinnorhea.      Mouth/Throat: Mucous membranes are moist.Oropharynx non-erythematous. Cardiovascular: Normal rate, regular rhythm. Grossly normal heart sounds.  Good peripheral circulation. Respiratory: Normal respiratory effort without tachypnea nor retractions. Breath sounds are clear and equal bilaterally. No wheezes/rales/rhonchi. Gastrointestinal: Soft and nontender. No distention. Obese abdomen. Musculoskeletal:  Nontender with normal range of motion in all extremities. No midline cervical, thoracic or lumbar tenderness to palpation.  except: Right medial knee diffuse tenderness to palpation, no swelling, no ecchymosis, skin intact, full range of motion, mild pain with resisted knee extension, no pain with resisted knee flexion, pain with medial stress. Right medial ankle and medial foot moderate tenderness to direct palpation, and is on, no ecchymosis, skin intact, pain with ankle rotation, no pain with plantar flexion or dorsiflexion, normal distal sensation and normal distal capillary refill. Right lower extremity otherwise nontender. No calf tenderness bilaterally.Bilateral pedal pulses equal and easily palpated. Patient ambulatory with mild antalgic gait. Neurologic:  Normal speech and language. No gross focal neurologic deficits are appreciated. Speech is normal. No gait instability.  Skin:  Skin is warm, dry and intact. No rash noted. Psychiatric: Mood and affect are normal. Speech and behavior are normal. Patient exhibits appropriate insight and judgment     ___________________________________________   LABS (all labs ordered are listed, but only abnormal results are displayed)  Labs Reviewed - No data to display  RADIOLOGY  Dg Ankle Complete Right  Result Date: 02/20/2016 CLINICAL DATA:  Pain in the right arch of foot and ankle EXAM: RIGHT ANKLE - COMPLETE 3+ VIEW COMPARISON:  None. FINDINGS: There is no evidence of fracture, dislocation, or joint effusion. Ankle mortise is intact. No significant soft tissue swelling. Base of fifth metatarsal appears unremarkable. Small dorsal calcaneal enthesophyte is of incidental note. There is no evidence of arthropathy or other focal bone abnormality. Soft tissues are unremarkable. IMPRESSION: Dorsal calcaneal enthesophytes.  No acute osseous abnormality. Electronically Signed   By: Tollie Eth M.D.   On: 02/20/2016 18:34   Dg Knee Complete 4 Views Right  Result Date: 02/20/2016 CLINICAL DATA:  Right anteromedial knee pain EXAM: RIGHT KNEE - COMPLETE 4+ VIEW COMPARISON:  None. FINDINGS: No evidence of fracture, dislocation, or joint effusion. No evidence of arthropathy or other focal bone abnormality. There is minimal spurring of the medial tibial spine. Soft tissues are unremarkable. IMPRESSION: No significant joint space narrowing or soft tissue abnormalities.  Minimal spurring off the medial tibial spine. No acute osseous abnormality. Electronically Signed   By: Tollie Ethavid  Kwon M.D.   On: 02/20/2016 18:32   Dg Foot Complete Right  Result Date: 02/20/2016 CLINICAL DATA:  Pain in the arch of the foot. EXAM: RIGHT FOOT COMPLETE - 3+ VIEW COMPARISON:  None. FINDINGS: There is no evidence of fracture or dislocation. Small dorsal calcaneal enthesophyte. There is no evidence of arthropathy or other focal bone abnormality. Soft tissues are unremarkable. IMPRESSION: No acute osseous abnormality.  Small dorsal calcaneal enthesophyte. Electronically Signed   By: Tollie Ethavid  Kwon M.D.   On: 02/20/2016 18:35   Dg Hip Unilat  W Or Wo Pelvis 2-3 Views Right  Result Date: 02/20/2016 CLINICAL DATA:  Right hip pain after twisting injury. EXAM: DG HIP (WITH OR WITHOUT PELVIS) 2-3V RIGHT COMPARISON:  None. FINDINGS: There is no evidence of hip fracture or dislocation. There is no evidence of arthropathy or other focal bone abnormality. Numerous phleboliths are seen within the pelvis bilaterally. There is lower lumbar facet arthropathy with sclerosis at L5-S1. The sacroiliac joints are symmetric in appearance. The bony pelvis appears intact. IMPRESSION: No acute osseous abnormality. Electronically Signed   By: Tollie Ethavid  Kwon M.D.   On: 02/20/2016 18:37   ____________________________________________   PROCEDURES Procedures     INITIAL IMPRESSION / ASSESSMENT AND PLAN / ED COURSE  Pertinent labs & imaging results that were available during my care of the patient were reviewed by me and considered in my medical decision making (see chart for details).  Well-appearing patient. No acute distress. Presents with complaints of right knee right ankle and right foot pain post mechanical injury last night. Denies any other pain. Suspect strain and contusion injuries. Will evaluate x-rays.  While patient in x-ray, patient began to complain of right hip pain. Evaluate patient on x-ray exam table and noted to have mild diffuse surrounding right lateral hip tenderness, full range of motion present, no ecchymosis, no swelling noted. Discussed in detail with patient suspect strain injury. Patient reports that she did not fall fully to the ground and did not have a direct hit injury. Patient proceeded to request x-ray and is concerned of acute bone abnormality and understands risk of radiation exposure, right hip x-ray evaluated.   X-rays reviewed. Per radiologist right hip, right knee, right ankle and right foot no acute osseous abnormalities-see full dictated report above.suspect strain and contusion injuries. Encouraged supportive care.  Patient requests crutches. Crutches and right ankle ace wrap applied. Mobic Rx given as needed for pain. Encouraged rest, ice, elevation and gradual application of weight as needed. Encourage PCP or  Orthopedic follow-up as needed for continued pain.  Discussed follow up with Primary care physician this week. Discussed follow up and return parameters including no resolution or any worsening concerns. Patient verbalized understanding and agreed to plan.   ____________________________________________   FINAL CLINICAL IMPRESSION(S) / ED DIAGNOSES  Final diagnoses:  Foot pain, right  Sprain of right ankle, unspecified ligament, initial encounter  Acute pain of right knee  Right hip pain     Discharge Medication List as of 02/20/2016  6:55 PM    START taking these medications   Details  meloxicam (MOBIC) 15 MG tablet Take 1 tablet (15 mg total) by mouth daily as needed for pain., Starting Tue 02/20/2016, Normal        Note: This dictation was prepared with Dragon dictation along with smaller phrase technology. Any transcriptional errors that result from this process  are unintentional.    Clinical Course      Renford Dills, NP 02/20/16 1920

## 2016-02-23 ENCOUNTER — Telehealth: Payer: Self-pay | Admitting: *Deleted

## 2016-02-23 NOTE — Telephone Encounter (Signed)
Courtesy call back, no answer, voicemail unavailable.

## 2018-03-03 IMAGING — CR DG ANKLE COMPLETE 3+V*R*
4 series · 4 of 4 positions shown · non-contrast
Comparison: None.

CLINICAL DATA: Pain in the right arch of foot and ankle

EXAM:
RIGHT ANKLE - COMPLETE 3+ VIEW

[ankle ap]
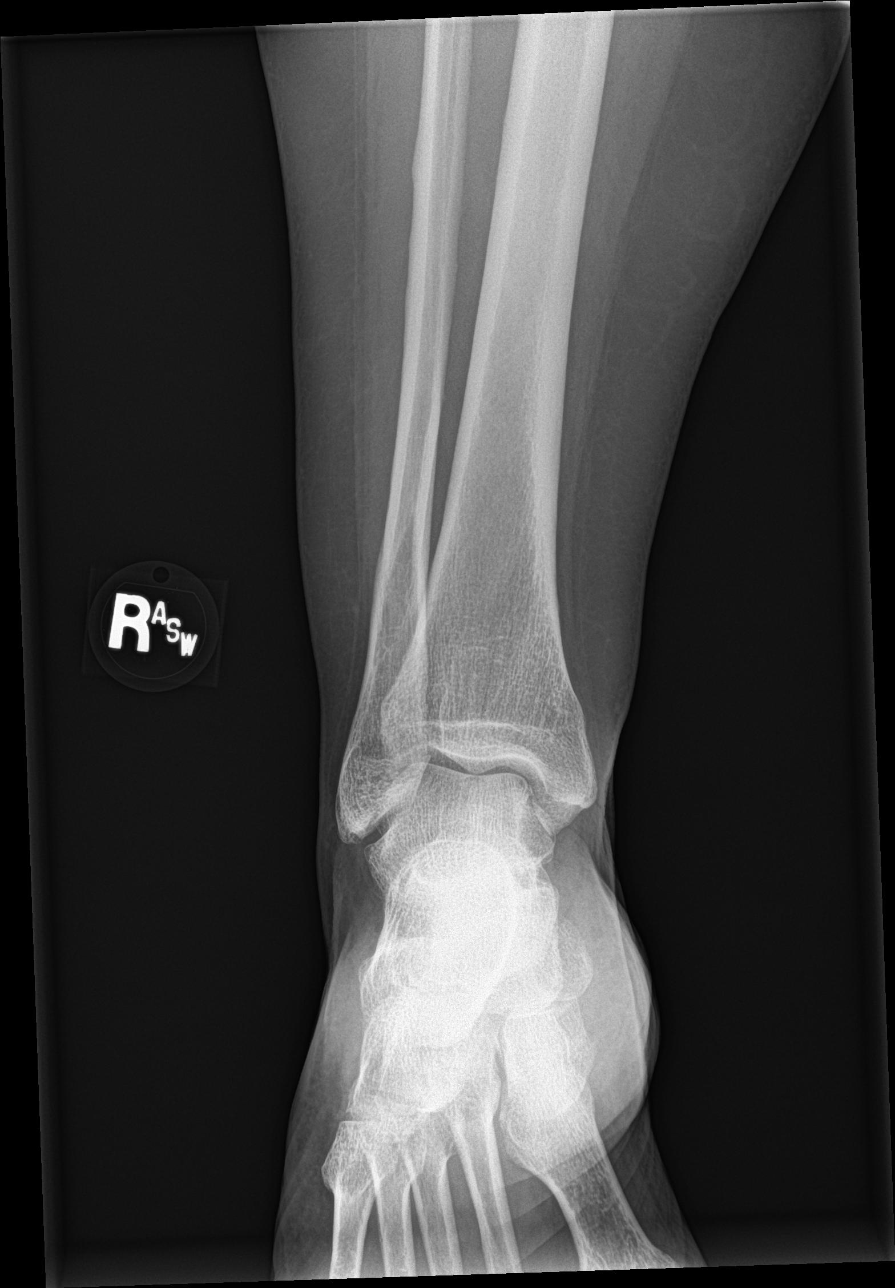

[ankle obl (1 of 2)]
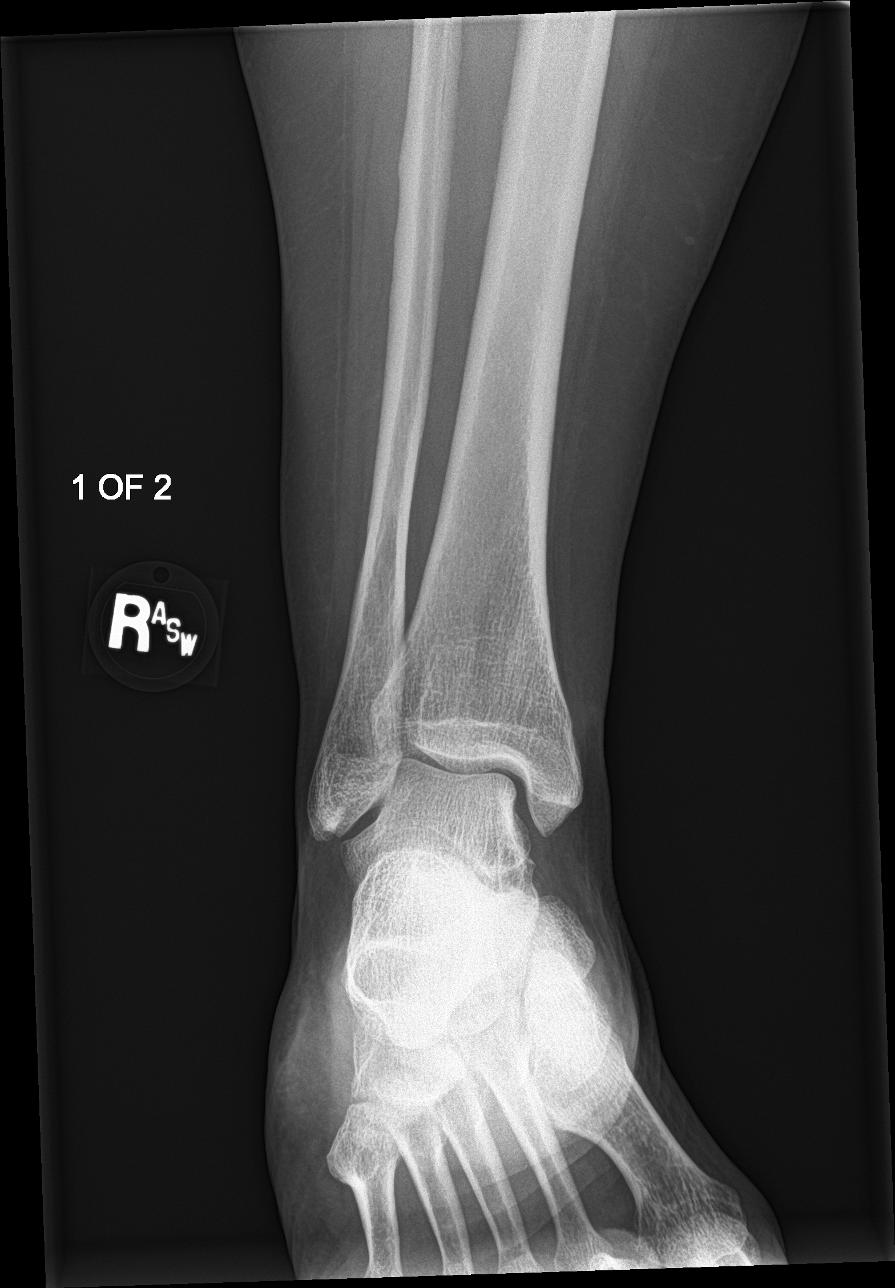

[ankle lat]
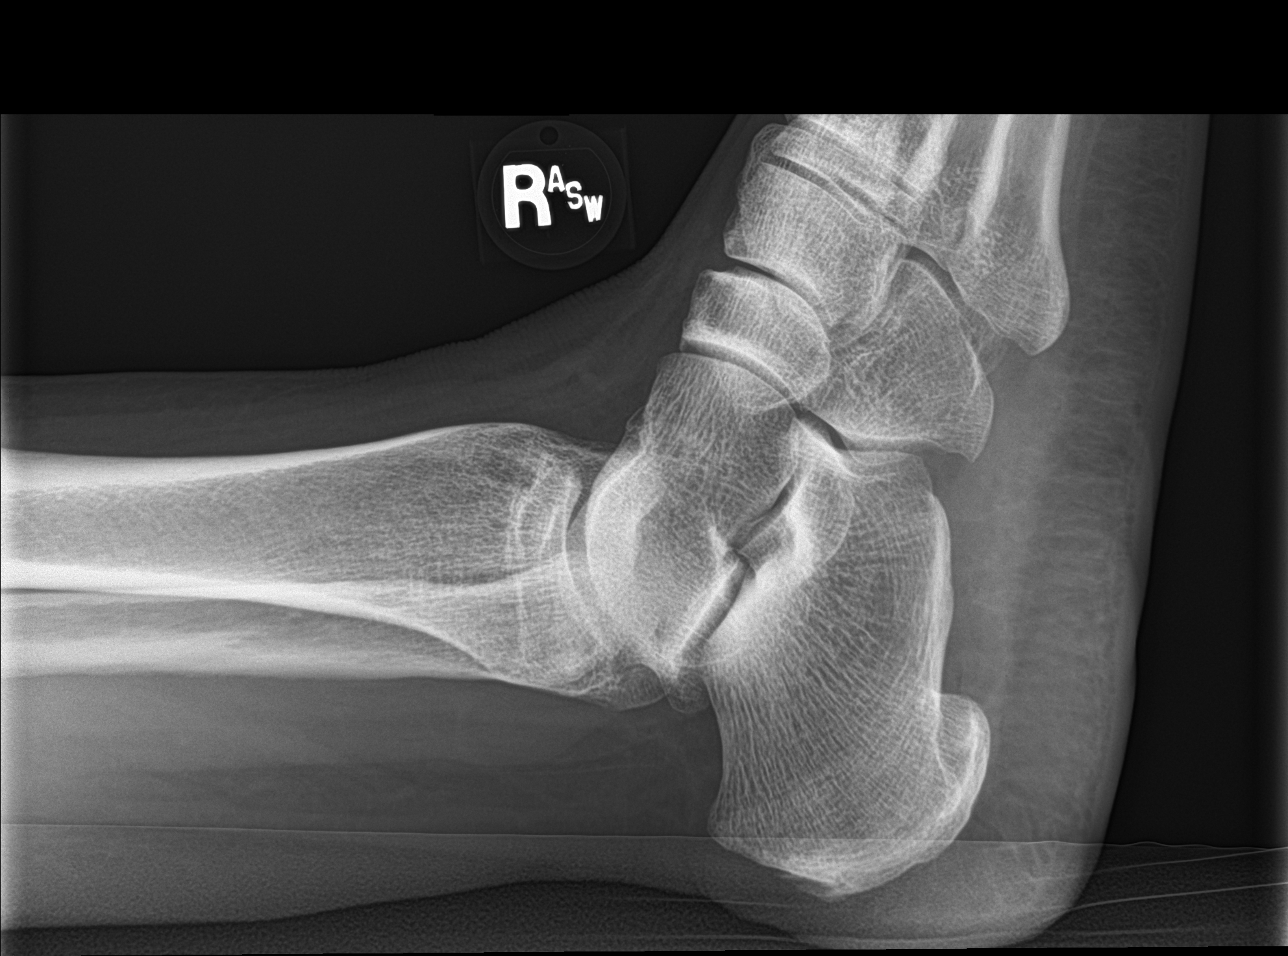

[ankle obl (2 of 2)]
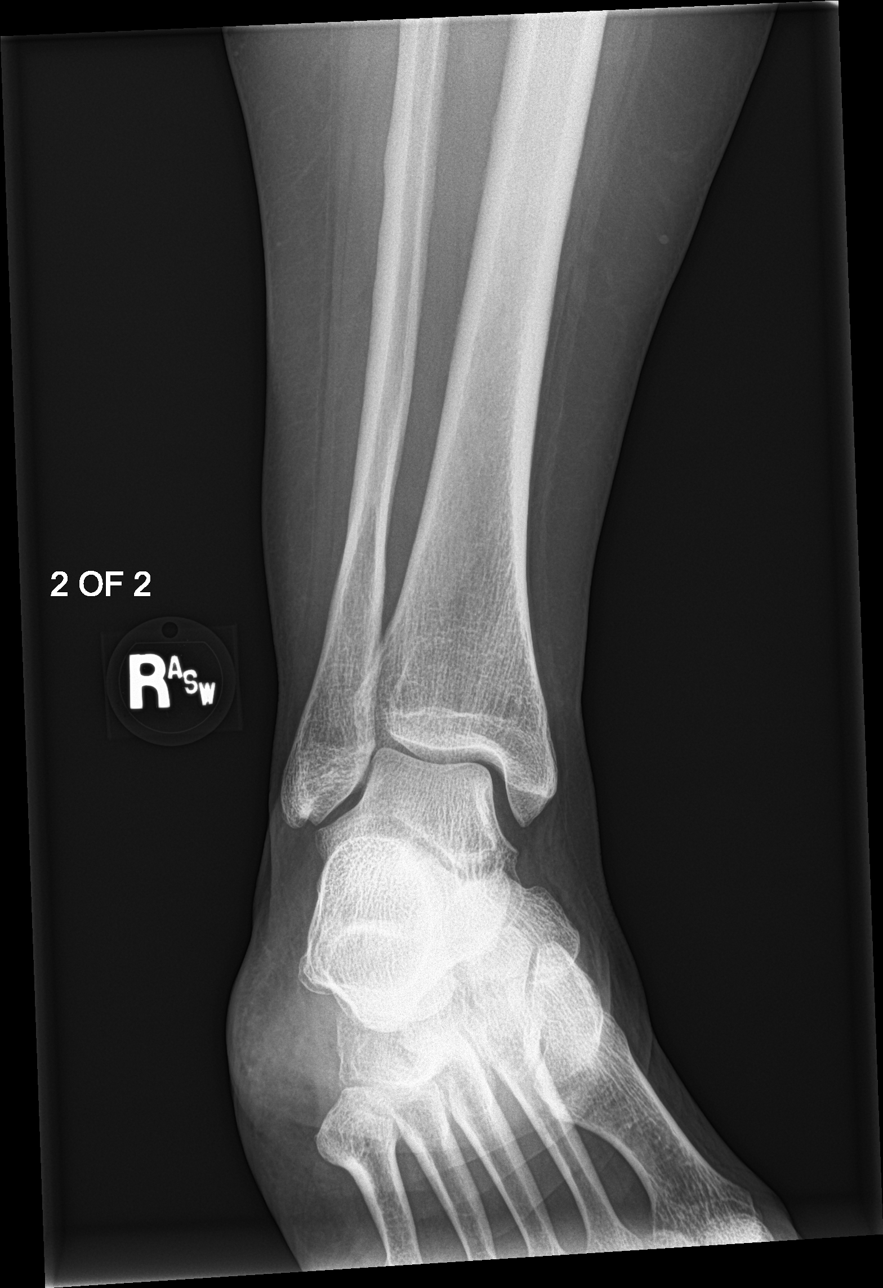

[4 of 4 positions shown; findings below may reference images not displayed]

FINDINGS: There is no evidence of fracture, dislocation, or joint effusion.
Ankle mortise is intact. No significant soft tissue swelling. Base
of fifth metatarsal appears unremarkable. Small dorsal calcaneal
enthesophyte is of incidental note. There is no evidence of
arthropathy or other focal bone abnormality. Soft tissues are
unremarkable.
IMPRESSION: Dorsal calcaneal enthesophytes.  No acute osseous abnormality.
